# Patient Record
Sex: Female | Born: 1985 | Race: Black or African American | Hispanic: No | Marital: Single | Smoking: Current every day smoker
Health system: Southern US, Community
[De-identification: ages and names within clinical notes are randomized; demographics above are authoritative.]

## PROBLEM LIST (undated history)

## (undated) ENCOUNTER — Inpatient Hospital Stay (HOSPITAL_COMMUNITY): Payer: Self-pay

## (undated) DIAGNOSIS — A749 Chlamydial infection, unspecified: Secondary | ICD-10-CM

## (undated) DIAGNOSIS — Z332 Encounter for elective termination of pregnancy: Secondary | ICD-10-CM

## (undated) DIAGNOSIS — R87619 Unspecified abnormal cytological findings in specimens from cervix uteri: Secondary | ICD-10-CM

## (undated) DIAGNOSIS — IMO0002 Reserved for concepts with insufficient information to code with codable children: Secondary | ICD-10-CM

## (undated) HISTORY — PX: DILATION AND CURETTAGE OF UTERUS: SHX78

## (undated) HISTORY — PX: NO PAST SURGERIES: SHX2092

---
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
## 2008-10-16 ENCOUNTER — Emergency Department (HOSPITAL_COMMUNITY): Admission: EM | Admit: 2008-10-16 | Discharge: 2008-10-16 | Payer: Self-pay | Admitting: Emergency Medicine

## 2011-03-31 ENCOUNTER — Other Ambulatory Visit (INDEPENDENT_AMBULATORY_CARE_PROVIDER_SITE_OTHER): Payer: BC Managed Care – PPO

## 2011-03-31 DIAGNOSIS — N979 Female infertility, unspecified: Secondary | ICD-10-CM

## 2011-04-13 ENCOUNTER — Other Ambulatory Visit (INDEPENDENT_AMBULATORY_CARE_PROVIDER_SITE_OTHER): Payer: BC Managed Care – PPO

## 2011-04-13 DIAGNOSIS — E282 Polycystic ovarian syndrome: Secondary | ICD-10-CM

## 2011-04-30 ENCOUNTER — Telehealth: Payer: Self-pay

## 2011-04-30 NOTE — Telephone Encounter (Signed)
Left message for pt. To return call regarding labs. PG CMA

## 2011-05-03 ENCOUNTER — Telehealth: Payer: Self-pay

## 2011-05-03 NOTE — Telephone Encounter (Signed)
Returned pt's call advised labs done on 04/13/11 were supportive of PCO. PG CMA

## 2011-10-19 ENCOUNTER — Inpatient Hospital Stay (HOSPITAL_COMMUNITY)
Admission: AD | Admit: 2011-10-19 | Discharge: 2011-10-19 | Disposition: A | Discharge status: Home / Self Care | Payer: Self-pay | Source: Ambulatory Visit | Attending: Obstetrics and Gynecology | Admitting: Obstetrics and Gynecology

## 2011-10-19 ENCOUNTER — Inpatient Hospital Stay (HOSPITAL_COMMUNITY): Payer: Self-pay

## 2011-10-19 ENCOUNTER — Encounter (HOSPITAL_COMMUNITY): Payer: Self-pay | Admitting: *Deleted

## 2011-10-19 DIAGNOSIS — O034 Incomplete spontaneous abortion without complication: Secondary | ICD-10-CM

## 2011-10-19 DIAGNOSIS — R109 Unspecified abdominal pain: Secondary | ICD-10-CM | POA: Insufficient documentation

## 2011-10-19 DIAGNOSIS — O209 Hemorrhage in early pregnancy, unspecified: Secondary | ICD-10-CM | POA: Insufficient documentation

## 2011-10-19 HISTORY — DX: Reserved for concepts with insufficient information to code with codable children: IMO0002

## 2011-10-19 HISTORY — DX: Chlamydial infection, unspecified: A74.9

## 2011-10-19 HISTORY — DX: Encounter for elective termination of pregnancy: Z33.2

## 2011-10-19 HISTORY — DX: Unspecified abnormal cytological findings in specimens from cervix uteri: R87.619

## 2011-10-19 LAB — URINALYSIS, ROUTINE W REFLEX MICROSCOPIC
Bilirubin Urine: NEGATIVE
Glucose, UA: NEGATIVE mg/dL
Ketones, ur: NEGATIVE mg/dL
Leukocytes, UA: NEGATIVE
Nitrite: NEGATIVE
Protein, ur: NEGATIVE mg/dL
Specific Gravity, Urine: 1.02 (ref 1.005–1.030)
Urobilinogen, UA: 0.2 mg/dL (ref 0.0–1.0)
pH: 7.5 (ref 5.0–8.0)

## 2011-10-19 LAB — URINE MICROSCOPIC-ADD ON

## 2011-10-19 LAB — POCT PREGNANCY, URINE: Preg Test, Ur: POSITIVE — AB

## 2011-10-19 NOTE — MAU Provider Note (Signed)
  History    This patient has given a history of vaginal bleeding since early this morning and associated with abdominal cramping. Soaked 1 maxi pad and now the bleeding has decreased. The patient had taken Ibuprofen 600mg s po x 1 2 hrs ago and has no cramping at the time of examination.  CSN: [id number]  Arrival date and time: 10/19/11 1157   First Provider Initiated Contact with Patient 10/19/11 1424      Chief Complaint  Patient presents with  . Vaginal Bleeding   HPI  OB History    Grav Para Term Preterm Abortions TAB SAB Ect Mult Living   5    3 1     0      Past Medical History  Diagnosis Date  . Elective abortion   . Abnormal Pap smear   . Chlamydia     Past Surgical History  Procedure Date  . No past surgeries     Family History  Problem Relation Age of Onset  . Other Neg Hx     History  Substance Use Topics  . Smoking status: Former Smoker -- 0.5 packs/day    Types: Cigarettes    Quit date: 10/05/2011  . Smokeless tobacco: Never Used  . Alcohol Use: No    Allergies: No Known Allergies  Prescriptions prior to admission  Medication Sig Dispense Refill  . ibuprofen (ADVIL,MOTRIN) 200 MG tablet Take 600 mg by mouth every 6 (six) hours as needed. For pain        Review of Systems  Constitutional: Negative.   HENT: Negative.   Eyes: Negative.   Respiratory: Negative.   Cardiovascular: Negative.   Gastrointestinal: Negative.   Genitourinary: Negative.   Musculoskeletal: Negative.   Skin: Negative.   Neurological: Negative.   Endo/Heme/Allergies: Negative.   Psychiatric/Behavioral: Negative.    Physical Exam   Blood pressure 126/76, pulse 67, temperature 98 F (36.7 C), temperature source Oral, resp. rate 18, height 5' 2.75" (1.594 m), weight 227 lb 2 oz (103.023 kg), last menstrual period 08/19/2011.  Physical Exam  Constitutional: She is oriented to person, place, and time. She appears well-developed and well-nourished.  HENT:  Head:  Normocephalic and atraumatic.  Eyes: Conjunctivae normal and EOM are normal. Pupils are equal, round, and reactive to light.  Neck: Normal range of motion.  Cardiovascular: Normal rate, regular rhythm and normal heart sounds.   Respiratory: Effort normal and breath sounds normal.  GI: Soft. Bowel sounds are normal.  Genitourinary: Vaginal discharge found.       Vaginal bleeding since this morning associated with cramping.  Musculoskeletal: Normal range of motion.  Neurological: She is alert and oriented to person, place, and time. She has normal reflexes.  Skin: Skin is warm and dry.  Psychiatric: She has a normal mood and affect.    MAU Course  Procedures  GA [redacted]w[redacted]d vaginal bleeding with cramping. Evaluation of Differential diagnosis A) SCH or B) SAB  Transvaginal USS Baseline Quant today    Assessment and Plan  Vaginal bleeding at [redacted]w[redacted]d Transvaginal USS-  Result - Probable early, approx 5 weeks IUGS. No adnexal mass identified. Recommended quant series and F/U USS in 10 days. Baseline Quant today and to have repeat Quant in 48 hrs. Also to f/u with Quant and f/u USS for viability pending on Quant results F/U [facility] 10/21/11 for Qant  Patrizia Paule, CNM. 10/19/2011, 2:26 PM

## 2011-10-19 NOTE — MAU Note (Signed)
Pt states unsure of LMP, thinks it is 08/19/2011. Here for ? Miscarriage. Started spotting Sunday night into this am, went to work, noted sharp pains, then "started flowing". Changing pad q30 minutes. G5P0, one prior miscarriage, TAB x3. Noted odor to vaginal discharge, though no abnormal color. Cramping in lower abdomen bilaterally.

## 2011-10-19 NOTE — Progress Notes (Signed)
Pt given a phone at her request and informed that we have received the report

## 2011-10-20 ENCOUNTER — Telehealth: Payer: Self-pay

## 2011-10-20 DIAGNOSIS — N939 Abnormal uterine and vaginal bleeding, unspecified: Secondary | ICD-10-CM

## 2011-10-20 NOTE — Telephone Encounter (Signed)
Message copied by Janeece Agee on Wed Oct 20, 2011 12:54 PM ------      Message from: Earl Gala      Created: Tue Oct 19, 2011  5:37 PM      Regarding: Needs Lab draw at the office : Quant for 10/21/11 ( 48 hrs f/u)       Hi Ty!       Needs a Quant on Thursday the 3rd for f/u 48hrs quant to see if pregnancy ongoing

## 2011-10-20 NOTE — Telephone Encounter (Signed)
Spoke with pt informing her Sharene Butters is needed per DD. Quant scheduled 10/21/11.

## 2011-10-20 NOTE — Telephone Encounter (Signed)
LM for pt regarding quant per DD.

## 2011-10-21 DIAGNOSIS — N939 Abnormal uterine and vaginal bleeding, unspecified: Secondary | ICD-10-CM

## 2011-10-21 LAB — HCG, QUANTITATIVE, PREGNANCY: hCG, Beta Chain, Quant, S: 2564 m[IU]/mL

## 2011-10-22 ENCOUNTER — Telehealth: Payer: Self-pay

## 2011-10-22 DIAGNOSIS — O039 Complete or unspecified spontaneous abortion without complication: Secondary | ICD-10-CM

## 2011-10-22 NOTE — Telephone Encounter (Signed)
Message copied by Janeece Agee on Fri Oct 22, 2011  4:04 PM ------      Message from: Earl Gala      Created: Fri Oct 22, 2011 11:28 AM      Regarding: Declining Quant       Hi Ty,      This lady has a declining Quant.      Tues: > 9000      Thurs: just over 2000            Will need f/u Quant on Monday.      Thanks,      [place].

## 2011-10-22 NOTE — Telephone Encounter (Signed)
Spoke with pt informing her quants are dropping and rpt quant is recommended per DD on Monday. Pt scheduled 10/25/11 @ 10:30 am. Pt voices understanding.

## 2011-10-25 ENCOUNTER — Other Ambulatory Visit: Payer: BC Managed Care – PPO

## 2011-11-04 ENCOUNTER — Encounter: Payer: BC Managed Care – PPO | Admitting: Obstetrics and Gynecology

## 2012-01-19 NOTE — L&D Delivery Note (Signed)
Delivery Note  Pt pushed approximately and then had vomiting as vtx began to crown   At 9:37 PM a viable female was delivered via Vaginal, Spontaneous Delivery (Presentation: Right Occiput Anterior).  Delivered through loose nuchal cord, APGAR: 9, 9; weight 7 lb 3.9 oz (3286 g).   Placenta status: Intact, Spontaneous.  Cord: 3 vessels with the following complications: None.  Cord pH: n/a   Anesthesia: Epidural  Episiotomy: None Lacerations: partial 3rd degree;  Suture Repair: 3.0 vicryl Est. Blood Loss (mL): 400cc   Dr [name] called to bs to assist w laceration repair  Mom to postpartum.  Baby to Couplet care / Skin to Skin. All counts correct   Allison Sheppard M 11/23/2012, 12:15 AM

## 2012-06-23 ENCOUNTER — Inpatient Hospital Stay (HOSPITAL_COMMUNITY)
Admission: AD | Admit: 2012-06-23 | Discharge: 2012-06-23 | Disposition: A | Discharge status: Home / Self Care | Payer: 59 | Source: Ambulatory Visit | Attending: Obstetrics and Gynecology | Admitting: Obstetrics and Gynecology

## 2012-06-23 ENCOUNTER — Encounter (HOSPITAL_COMMUNITY): Payer: Self-pay

## 2012-06-23 DIAGNOSIS — R42 Dizziness and giddiness: Secondary | ICD-10-CM | POA: Insufficient documentation

## 2012-06-23 DIAGNOSIS — O219 Vomiting of pregnancy, unspecified: Secondary | ICD-10-CM

## 2012-06-23 DIAGNOSIS — O210 Mild hyperemesis gravidarum: Secondary | ICD-10-CM | POA: Insufficient documentation

## 2012-06-23 LAB — URINALYSIS, ROUTINE W REFLEX MICROSCOPIC
Bilirubin Urine: NEGATIVE
Glucose, UA: NEGATIVE mg/dL
Ketones, ur: >80 mg/dL — AB
Leukocytes, UA: NEGATIVE
Nitrite: NEGATIVE
Specific Gravity, Urine: >1.03 — ABNORMAL HIGH (ref 1.005–1.030)
pH: 6 (ref 5.0–8.0)

## 2012-06-23 LAB — WET PREP, GENITAL: Yeast Wet Prep HPF POC: NONE SEEN

## 2012-06-23 LAB — CBC WITH DIFFERENTIAL/PLATELET
HCT: 38.7 % (ref 36.0–46.0)
Hemoglobin: 13.3 g/dL (ref 12.0–15.0)
Lymphs Abs: 2.9 10*3/uL (ref 0.7–4.0)
MCH: 29.3 pg (ref 26.0–34.0)
MCHC: 34.4 g/dL (ref 30.0–36.0)
Monocytes Absolute: 0.8 10*3/uL (ref 0.1–1.0)
Monocytes Relative: 5 % (ref 3–12)
Neutro Abs: 11.2 10*3/uL — ABNORMAL HIGH (ref 1.7–7.7)
Neutrophils Relative %: 75 % (ref 43–77)
RBC: 4.54 MIL/uL (ref 3.87–5.11)

## 2012-06-23 LAB — COMPREHENSIVE METABOLIC PANEL
ALT: 41 U/L — ABNORMAL HIGH (ref 0–35)
AST: 36 U/L (ref 0–37)
Albumin: 3.4 g/dL — ABNORMAL LOW (ref 3.5–5.2)
CO2: 23 mEq/L (ref 19–32)
Calcium: 9.2 mg/dL (ref 8.4–10.5)
Chloride: 102 mEq/L (ref 96–112)
Glucose, Bld: 85 mg/dL (ref 70–99)
Potassium: 3.6 mEq/L (ref 3.5–5.1)
Total Protein: 7.2 g/dL (ref 6.0–8.3)

## 2012-06-23 LAB — URINE MICROSCOPIC-ADD ON

## 2012-06-23 MED ORDER — LACTATED RINGERS IV SOLN
INTRAVENOUS | Status: DC
  Administered 2012-06-23: 17:00:00 via INTRAVENOUS

## 2012-06-23 MED ORDER — LACTATED RINGERS IV BOLUS (SEPSIS)
500.0000 mL | Freq: Once | INTRAVENOUS | Status: AC
  Administered 2012-06-23: 500 mL via INTRAVENOUS

## 2012-06-23 MED ORDER — PANTOPRAZOLE SODIUM 20 MG PO TBEC: 20.0000 mg | DELAYED_RELEASE_TABLET | Freq: Every day | ORAL | Status: DC

## 2012-06-23 MED ORDER — ONDANSETRON HCL 4 MG/2ML IJ SOLN
4.0000 mg | Freq: Once | INTRAMUSCULAR | Status: AC
  Administered 2012-06-23: 4 mg via INTRAVENOUS
  Filled 2012-06-23: qty 2

## 2012-06-23 NOTE — MAU Note (Signed)
Patient states she had gotten food poisoning on 6-4 and was given Zofran. Patient states that she has continued to have vomiting and has been taking Zofran. States she has been feeling dizzy and lightheaded. Denies any pregnancy problems, bleeding, leaking or pain.

## 2012-06-23 NOTE — MAU Provider Note (Signed)
History   27 yo, [id number] at [redacted]w[redacted]d presents after calling the office with c/o N/V which she feels is associated with the dizziness she is experiencing.  Wed she ate a chicken sandwich from [employer] and soon after n/v.  Spoke to office and got Zofran ODT.  Reports Zofran works a little bit.  She states that she just doesn't feel like eating and drinking cause of the dizziness.  Pt reports that she feels like she is not able to fully empty her bladder when she voids but denies burning with urination.  Denies VB, UCs, LOF, recent fever, resp or GI c/o's, PIH s/s. No one is sick in her family.  She has not begun her [facility] d/t insurance issues.  Chief Complaint  Patient presents with  . Dizziness  . Emesis    OB History   Grav Para Term Preterm Abortions TAB SAB Ect Mult Living   6 2   3 1    2       Past Medical History  Diagnosis Date  . Elective abortion   . Abnormal Pap smear   . Chlamydia     Past Surgical History  Procedure Laterality Date  . No past surgeries      Family History  Problem Relation Age of Onset  . Other Neg Hx     History  Substance Use Topics  . Smoking status: Former Smoker -- 0.50 packs/day    Types: Cigarettes    Quit date: 10/05/2011  . Smokeless tobacco: Never Used  . Alcohol Use: No    Allergies: No Known Allergies  Prescriptions prior to admission  Medication Sig Dispense Refill  . loratadine (CLARITIN) 10 MG tablet Take 10 mg by mouth daily as needed for allergies.      Marland Kitchen ondansetron (ZOFRAN-ODT) 4 MG disintegrating tablet Take 4 mg by mouth every 8 (eight) hours as needed for nausea.      . Prenatal Vit-Min-FA-Fish Oil ([employer] PRENATAL GUMMY PO) Take 2 tablets by mouth daily.        ROS: see HPI above, all other systems are negative   Physical Exam   Blood pressure 120/71, pulse 84, temperature 98.3 F (36.8 C), temperature source Oral, resp. rate 16, height 5' 2.5" (1.588 m), weight 246 lb 12.8 oz (111.948 kg), last menstrual period  02/12/2012, SpO2 100.00%, unknown if currently breastfeeding.  Chest: Clear Heart: RRR Abdomen: gravid, NT Extremities: WNL  Results for orders placed during the hospital encounter of 06/23/12 (from the past 24 hour(s))  URINALYSIS, ROUTINE W REFLEX MICROSCOPIC     Status: Abnormal   Collection Time    06/23/12  2:25 PM      Result Value Range   Color, Urine AMBER (*) YELLOW   APPearance CLEAR  CLEAR   Specific Gravity, Urine >1.030 (*) 1.005 - 1.030   pH 6.0  5.0 - 8.0   Glucose, UA NEGATIVE  NEGATIVE mg/dL   Hgb urine dipstick TRACE (*) NEGATIVE   Bilirubin Urine NEGATIVE  NEGATIVE   Ketones, ur >80 (*) NEGATIVE mg/dL   Protein, ur NEGATIVE  NEGATIVE mg/dL   Urobilinogen, UA 1.0  0.0 - 1.0 mg/dL   Nitrite NEGATIVE  NEGATIVE   Leukocytes, UA NEGATIVE  NEGATIVE  URINE MICROSCOPIC-ADD ON     Status: Abnormal   Collection Time    06/23/12  2:25 PM      Result Value Range   Squamous Epithelial / LPF FEW (*) RARE   WBC, UA 0-2  <3  WBC/hpf   Bacteria, UA FEW (*) RARE  CBC WITH DIFFERENTIAL     Status: Abnormal   Collection Time    06/23/12  2:33 PM      Result Value Range   WBC 14.9 (*) 4.0 - 10.5 K/uL   RBC 4.54  3.87 - 5.11 MIL/uL   Hemoglobin 13.3  12.0 - 15.0 g/dL   HCT 16.1  09.6 - 04.5 %   MCV 85.2  78.0 - 100.0 fL   MCH 29.3  26.0 - 34.0 pg   MCHC 34.4  30.0 - 36.0 g/dL   RDW 40.9  81.1 - 91.4 %   Platelets 329  150 - 400 K/uL   Neutrophils Relative % 75  43 - 77 %   Neutro Abs 11.2 (*) 1.7 - 7.7 K/uL   Lymphocytes Relative 20  12 - 46 %   Lymphs Abs 2.9  0.7 - 4.0 K/uL   Monocytes Relative 5  3 - 12 %   Monocytes Absolute 0.8  0.1 - 1.0 K/uL   Eosinophils Relative 0  0 - 5 %   Eosinophils Absolute 0.0  0.0 - 0.7 K/uL   Basophils Relative 0  0 - 1 %   Basophils Absolute 0.0  0.0 - 0.1 K/uL  COMPREHENSIVE METABOLIC PANEL     Status: Abnormal   Collection Time    06/23/12  2:33 PM      Result Value Range   Sodium 135  135 - 145 mEq/L   Potassium 3.6  3.5 -  5.1 mEq/L   Chloride 102  96 - 112 mEq/L   CO2 23  19 - 32 mEq/L   Glucose, Bld 85  70 - 99 mg/dL   BUN 7  6 - 23 mg/dL   Creatinine, Ser 7.82  0.50 - 1.10 mg/dL   Calcium 9.2  8.4 - 95.6 mg/dL   Total Protein 7.2  6.0 - 8.3 g/dL   Albumin 3.4 (*) 3.5 - 5.2 g/dL   AST 36  0 - 37 U/L   ALT 41 (*) 0 - 35 U/L   Alkaline Phosphatase 68  39 - 117 U/L   Total Bilirubin 0.4  0.3 - 1.2 mg/dL   GFR calc non Af Amer >90  >90 mL/min   GFR calc Af Amer >90  >90 mL/min  WET PREP, GENITAL     Status: Abnormal   Collection Time    06/23/12  4:41 PM      Result Value Range   Yeast Wet Prep HPF POC NONE SEEN  NONE SEEN   Trich, Wet Prep NONE SEEN  NONE SEEN   Clue Cells Wet Prep HPF POC FEW (*) NONE SEEN   WBC, Wet Prep HPF POC FEW (*) NONE SEEN     ED Course  IUP at 103w6d Dizziness and N/V  IV LR Bolus Zofran 4 mg IV once PO trial  C/w Dr Normand Sloop "Feeling better" Tolerated PO  D/c home to continue Zofran Rx Protonix 20 mg QD  F/u as soon as possible with [facility] to start [facility]   Haroldine Laws CNM, MSN 06/23/2012 6:03 PM

## 2012-06-26 ENCOUNTER — Inpatient Hospital Stay (HOSPITAL_COMMUNITY): Payer: 59

## 2012-06-26 ENCOUNTER — Inpatient Hospital Stay (HOSPITAL_COMMUNITY)
Admission: AD | Admit: 2012-06-26 | Discharge: 2012-06-26 | Disposition: A | Discharge status: Home / Self Care | Payer: 59 | Source: Ambulatory Visit | Attending: Obstetrics and Gynecology | Admitting: Obstetrics and Gynecology

## 2012-06-26 ENCOUNTER — Encounter (HOSPITAL_COMMUNITY): Payer: Self-pay | Admitting: *Deleted

## 2012-06-26 DIAGNOSIS — E282 Polycystic ovarian syndrome: Secondary | ICD-10-CM | POA: Diagnosis not present

## 2012-06-26 DIAGNOSIS — O99891 Other specified diseases and conditions complicating pregnancy: Secondary | ICD-10-CM | POA: Insufficient documentation

## 2012-06-26 DIAGNOSIS — R42 Dizziness and giddiness: Secondary | ICD-10-CM | POA: Diagnosis present

## 2012-06-26 DIAGNOSIS — E860 Dehydration: Secondary | ICD-10-CM | POA: Insufficient documentation

## 2012-06-26 DIAGNOSIS — Z9189 Other specified personal risk factors, not elsewhere classified: Secondary | ICD-10-CM

## 2012-06-26 DIAGNOSIS — O211 Hyperemesis gravidarum with metabolic disturbance: Secondary | ICD-10-CM | POA: Insufficient documentation

## 2012-06-26 DIAGNOSIS — O0930 Supervision of pregnancy with insufficient antenatal care, unspecified trimester: Secondary | ICD-10-CM

## 2012-06-26 LAB — CBC WITH DIFFERENTIAL/PLATELET
Basophils Absolute: 0 10*3/uL (ref 0.0–0.1)
Eosinophils Relative: 0 % (ref 0–5)
HCT: 39 % (ref 36.0–46.0)
Hemoglobin: 13.5 g/dL (ref 12.0–15.0)
Lymphocytes Relative: 18 % (ref 12–46)
MCHC: 34.6 g/dL (ref 30.0–36.0)
MCV: 84.8 fL (ref 78.0–100.0)
Monocytes Absolute: 0.8 10*3/uL (ref 0.1–1.0)
Monocytes Relative: 6 % (ref 3–12)
RDW: 13.9 % (ref 11.5–15.5)
WBC: 14.7 10*3/uL — ABNORMAL HIGH (ref 4.0–10.5)

## 2012-06-26 LAB — COMPREHENSIVE METABOLIC PANEL
AST: 21 U/L (ref 0–37)
BUN: 4 mg/dL — ABNORMAL LOW (ref 6–23)
CO2: 22 mEq/L (ref 19–32)
Calcium: 9.4 mg/dL (ref 8.4–10.5)
Chloride: 101 mEq/L (ref 96–112)
Creatinine, Ser: 0.57 mg/dL (ref 0.50–1.10)
GFR calc Af Amer: >90 mL/min (ref >90)
GFR calc non Af Amer: >90 mL/min (ref >90)
Total Bilirubin: 0.3 mg/dL (ref 0.3–1.2)

## 2012-06-26 LAB — URINALYSIS, ROUTINE W REFLEX MICROSCOPIC
Glucose, UA: NEGATIVE mg/dL
Hgb urine dipstick: NEGATIVE
Ketones, ur: >80 mg/dL — AB
Protein, ur: NEGATIVE mg/dL
pH: 6 (ref 5.0–8.0)

## 2012-06-26 MED ORDER — MECLIZINE HCL 25 MG PO TABS
50.0000 mg | ORAL_TABLET | Freq: Two times a day (BID) | ORAL | Status: DC
  Administered 2012-06-26: 50 mg via ORAL
  Filled 2012-06-26 (×3): qty 2

## 2012-06-26 MED ORDER — ONDANSETRON HCL 4 MG/2ML IJ SOLN
4.0000 mg | Freq: Once | INTRAMUSCULAR | Status: AC
  Administered 2012-06-26: 4 mg via INTRAVENOUS
  Filled 2012-06-26: qty 2

## 2012-06-26 MED ORDER — LACTATED RINGERS IV SOLN
INTRAVENOUS | Status: DC
  Administered 2012-06-26: 13:00:00 via INTRAVENOUS

## 2012-06-26 MED ORDER — LACTATED RINGERS IV SOLN
Freq: Once | INTRAVENOUS | Status: AC
  Administered 2012-06-26: 16:00:00 via INTRAVENOUS

## 2012-06-26 MED ORDER — LACTATED RINGERS IV BOLUS (SEPSIS)
500.0000 mL | Freq: Once | INTRAVENOUS | Status: AC
  Administered 2012-06-26: 500 mL via INTRAVENOUS

## 2012-06-26 MED ORDER — MECLIZINE HCL 50 MG PO TABS: 50.0000 mg | ORAL_TABLET | Freq: Two times a day (BID) | ORAL | Status: DC

## 2012-06-26 NOTE — MAU Note (Signed)
Patient state she has not been able to eat and has vomiting when she does eat. Gets dizzy and lightheaded. Denies pain or bleeding.

## 2012-06-26 NOTE — MAU Provider Note (Signed)
History   27 yo G2P0010 at 41 2/7 weeks by LMP presented unannounced c/o dizziness and N/V approx 1 week--seen 6/6 in MAU for same, with labs and cultures done that day, IV hydration, and Zofran.  Patient has not started [facility] yet at [facility] due to insurance issues.  Denies fever, dysuria, bleeding, leaking, HA, viral exposure, visual issues, or any other sx.  Hx seasonal allergies, but has been off Claritin for 2 weeks "since I haven't had any allergy sx recently"  Reports the dizziness is the primary issue, noted when she changes position or turns.  Nausea occurs after the dizziness.    Patient Active Problem List   Diagnosis Date Noted  . Late prenatal care 06/26/2012  . PCOS (polycystic ovarian syndrome) 06/26/2012  . Potential abnormality of glucose tolerance 06/26/2012  . Vaginal bleeding in pregnancy 06/26/2012  Seen in 1st trimester for bleeding.  Chief Complaint  Patient presents with  . Dizziness  . Emesis During Pregnancy     OB History   Grav Para Term Preterm Abortions TAB SAB Ect Mult Living   2 0   1  1   0      Past Medical History  Diagnosis Date  . Elective abortion   . Abnormal Pap smear   . Chlamydia     Past Surgical History  Procedure Laterality Date  . No past surgeries      Family History  Problem Relation Age of Onset  . Other Neg Hx     History  Substance Use Topics  . Smoking status: Former Smoker -- 0.50 packs/day    Types: Cigarettes    Quit date: 10/05/2011  . Smokeless tobacco: Never Used  . Alcohol Use: No    Allergies: No Known Allergies  Prescriptions prior to admission  Medication Sig Dispense Refill  . ondansetron (ZOFRAN-ODT) 4 MG disintegrating tablet Take 4 mg by mouth every 8 (eight) hours as needed for nausea.      Marland Kitchen loratadine (CLARITIN) 10 MG tablet Take 10 mg by mouth daily as needed for allergies.      . pantoprazole (PROTONIX) 20 MG tablet Take 1 tablet (20 mg total) by mouth daily.  30 tablet  1  . Prenatal  Vit-Min-FA-Fish Oil ([employer] PRENATAL GUMMY PO) Take 2 tablets by mouth daily.         Physical Exam   Blood pressure 129/79, pulse 96, temperature 98.6 F (37 C), temperature source Oral, resp. rate 16, last menstrual period 02/12/2012, SpO2 99.00%.  Chest clear Ears--left ear drum full, no erythema or drainage;  Right eardrum clear Heart RRR without murmur Abd gravid, NT, FH approx 19 weeks Pelvic--deferred Ext WNL  FHR 160  Results for orders placed during the hospital encounter of 06/26/12 (from the past 24 hour(s))  CBC WITH DIFFERENTIAL     Status: Abnormal   Collection Time    06/26/12 12:05 PM      Result Value Range   WBC 14.7 (*) 4.0 - 10.5 K/uL   RBC 4.60  3.87 - 5.11 MIL/uL   Hemoglobin 13.5  12.0 - 15.0 g/dL   HCT 16.1  09.6 - 04.5 %   MCV 84.8  78.0 - 100.0 fL   MCH 29.3  26.0 - 34.0 pg   MCHC 34.6  30.0 - 36.0 g/dL   RDW 40.9  81.1 - 91.4 %   Platelets 330  150 - 400 K/uL   Neutrophils Relative % 76  43 - 77 %  Neutro Abs 11.2 (*) 1.7 - 7.7 K/uL   Lymphocytes Relative 18  12 - 46 %   Lymphs Abs 2.7  0.7 - 4.0 K/uL   Monocytes Relative 6  3 - 12 %   Monocytes Absolute 0.8  0.1 - 1.0 K/uL   Eosinophils Relative 0  0 - 5 %   Eosinophils Absolute 0.0  0.0 - 0.7 K/uL   Basophils Relative 0  0 - 1 %   Basophils Absolute 0.0  0.0 - 0.1 K/uL  COMPREHENSIVE METABOLIC PANEL     Status: Abnormal   Collection Time    06/26/12 12:05 PM      Result Value Range   Sodium 134 (*) 135 - 145 mEq/L   Potassium 3.9  3.5 - 5.1 mEq/L   Chloride 101  96 - 112 mEq/L   CO2 22  19 - 32 mEq/L   Glucose, Bld 85  70 - 99 mg/dL   BUN 4 (*) 6 - 23 mg/dL   Creatinine, Ser 8.46  0.50 - 1.10 mg/dL   Calcium 9.4  8.4 - 96.2 mg/dL   Total Protein 7.3  6.0 - 8.3 g/dL   Albumin 3.2 (*) 3.5 - 5.2 g/dL   AST 21  0 - 37 U/L   ALT 28  0 - 35 U/L   Alkaline Phosphatase 71  39 - 117 U/L   Total Bilirubin 0.3  0.3 - 1.2 mg/dL   GFR calc non Af Amer >90  >90 mL/min   GFR calc Af Amer >90   >90 mL/min  URINALYSIS, ROUTINE W REFLEX MICROSCOPIC     Status: Abnormal   Collection Time    06/26/12 12:15 PM      Result Value Range   Color, Urine YELLOW  YELLOW   APPearance CLEAR  CLEAR   Specific Gravity, Urine 1.025  1.005 - 1.030   pH 6.0  5.0 - 8.0   Glucose, UA NEGATIVE  NEGATIVE mg/dL   Hgb urine dipstick NEGATIVE  NEGATIVE   Bilirubin Urine NEGATIVE  NEGATIVE   Ketones, ur >80 (*) NEGATIVE mg/dL   Protein, ur NEGATIVE  NEGATIVE mg/dL   Urobilinogen, UA 1.0  0.0 - 1.0 mg/dL   Nitrite NEGATIVE  NEGATIVE   Leukocytes, UA NEGATIVE  NEGATIVE   Limited US:  SIUP, 17 6/7 weeks by [place], with EDC 11/11/4 (19 2/7 weeks by LMP).  Placenta posterior, cervix 3.09  Able to tolerate sips of liquids and few crackers in MAU with administration of Meclizine.  ED Course  IUP at 19 2/7 weeks LMP, 17 6/7 weeks by [place] Dizziness/vertigo, with N/V resulting--? Due to inner ear congestion Dehydration Hx PCOS No prenatal care  Plan: Consulted with Dr. [name] IV hydration with LR--2 bags total Zofran for nausea (has Rx at home for this) Meclizine po for dizziness. Will complete current IV fluids, then plan d/c home. Rx Meclizine 50 mg po BID--sent to patient's pharmacy Continue Zofran Restart Claritin daily OOW x 2 days, to allow for taking of meds and increasing po fluids.  Works at [employer].  RTW on Thursday. Needs to schedule OB care ASAP.   Nigel Bridgeman CNM, [place] 06/26/2012 3:21 PM

## 2012-07-26 LAB — OB RESULTS CONSOLE HEPATITIS B SURFACE ANTIGEN: Hepatitis B Surface Ag: NEGATIVE

## 2012-07-26 LAB — OB RESULTS CONSOLE ABO/RH: RH Type: POSITIVE

## 2012-07-26 LAB — OB RESULTS CONSOLE RPR: RPR: NONREACTIVE

## 2012-07-26 LAB — OB RESULTS CONSOLE HIV ANTIBODY (ROUTINE TESTING): HIV: NONREACTIVE

## 2012-10-25 LAB — OB RESULTS CONSOLE GBS: GBS: NEGATIVE

## 2012-11-22 ENCOUNTER — Encounter (HOSPITAL_COMMUNITY): Payer: Self-pay | Admitting: *Deleted

## 2012-11-22 ENCOUNTER — Inpatient Hospital Stay (HOSPITAL_COMMUNITY): Payer: 59 | Admitting: Anesthesiology

## 2012-11-22 ENCOUNTER — Inpatient Hospital Stay (HOSPITAL_COMMUNITY)
Admission: AD | Admit: 2012-11-22 | Discharge: 2012-11-24 | DRG: 775 | Disposition: A | Discharge status: Home / Self Care | Payer: 59 | Source: Ambulatory Visit | Attending: Obstetrics and Gynecology | Admitting: Obstetrics and Gynecology

## 2012-11-22 ENCOUNTER — Encounter (HOSPITAL_COMMUNITY): Payer: 59 | Admitting: Anesthesiology

## 2012-11-22 DIAGNOSIS — O9903 Anemia complicating the puerperium: Secondary | ICD-10-CM | POA: Diagnosis not present

## 2012-11-22 DIAGNOSIS — D649 Anemia, unspecified: Secondary | ICD-10-CM | POA: Diagnosis not present

## 2012-11-22 LAB — CBC
HCT: 37.4 % (ref 36.0–46.0)
Hemoglobin: 12.9 g/dL (ref 12.0–15.0)
MCH: 27.9 pg (ref 26.0–34.0)
MCHC: 34.5 g/dL (ref 30.0–36.0)

## 2012-11-22 LAB — ABO/RH: ABO/RH(D): O POS

## 2012-11-22 MED ORDER — OXYTOCIN 40 UNITS IN LACTATED RINGERS INFUSION - SIMPLE MED
1.0000 m[IU]/min | INTRAVENOUS | Status: DC
  Administered 2012-11-22: 2 m[IU]/min via INTRAVENOUS
  Filled 2012-11-22: qty 1000

## 2012-11-22 MED ORDER — FENTANYL CITRATE 0.05 MG/ML IJ SOLN
INTRAMUSCULAR | Status: DC | PRN
  Administered 2012-11-22: 100 ug via EPIDURAL

## 2012-11-22 MED ORDER — FENTANYL CITRATE 0.05 MG/ML IJ SOLN
100.0000 ug | Freq: Once | INTRAMUSCULAR | Status: AC
  Administered 2012-11-22: 100 ug via INTRAVENOUS
  Filled 2012-11-22: qty 2

## 2012-11-22 MED ORDER — PHENYLEPHRINE 40 MCG/ML (10ML) SYRINGE FOR IV PUSH (FOR BLOOD PRESSURE SUPPORT)
PREFILLED_SYRINGE | INTRAVENOUS | Status: AC
  Filled 2012-11-22: qty 5

## 2012-11-22 MED ORDER — FENTANYL 2.5 MCG/ML BUPIVACAINE 1/10 % EPIDURAL INFUSION (WH - ANES)
INTRAMUSCULAR | Status: DC | PRN
  Administered 2012-11-22: 12.5 mL/h via EPIDURAL

## 2012-11-22 MED ORDER — FLEET ENEMA 7-19 GM/118ML RE ENEM: 1.0000 | ENEMA | RECTAL | Status: DC | PRN

## 2012-11-22 MED ORDER — CITRIC ACID-SODIUM CITRATE 334-500 MG/5ML PO SOLN: 30.0000 mL | ORAL | Status: DC | PRN

## 2012-11-22 MED ORDER — LIDOCAINE HCL (PF) 1 % IJ SOLN
30.0000 mL | INTRAMUSCULAR | Status: DC | PRN
  Administered 2012-11-22: 30 mL via SUBCUTANEOUS
  Filled 2012-11-22 (×3): qty 30

## 2012-11-22 MED ORDER — LACTATED RINGERS IV SOLN
500.0000 mL | INTRAVENOUS | Status: DC | PRN
  Administered 2012-11-22: 1000 mL via INTRAVENOUS
  Administered 2012-11-23: 500 mL via INTRAVENOUS

## 2012-11-22 MED ORDER — TERBUTALINE SULFATE 1 MG/ML IJ SOLN: 0.2500 mg | Freq: Once | INTRAMUSCULAR | Status: AC | PRN

## 2012-11-22 MED ORDER — ACETAMINOPHEN 325 MG PO TABS: 650.0000 mg | ORAL_TABLET | ORAL | Status: DC | PRN

## 2012-11-22 MED ORDER — LIDOCAINE HCL (PF) 1 % IJ SOLN
INTRAMUSCULAR | Status: DC | PRN
  Administered 2012-11-22 (×2): 4 mL

## 2012-11-22 MED ORDER — EPHEDRINE 5 MG/ML INJ
INTRAVENOUS | Status: AC
  Filled 2012-11-22: qty 4

## 2012-11-22 MED ORDER — ONDANSETRON HCL 4 MG/2ML IJ SOLN
4.0000 mg | Freq: Four times a day (QID) | INTRAMUSCULAR | Status: DC | PRN
  Administered 2012-11-22: 4 mg via INTRAVENOUS
  Filled 2012-11-22: qty 2

## 2012-11-22 MED ORDER — LACTATED RINGERS IV SOLN
INTRAVENOUS | Status: DC
  Administered 2012-11-22 – 2012-11-23 (×4): via INTRAVENOUS

## 2012-11-22 MED ORDER — OXYTOCIN BOLUS FROM INFUSION
500.0000 mL | INTRAVENOUS | Status: DC
  Administered 2012-11-22: 500 mL via INTRAVENOUS

## 2012-11-22 MED ORDER — BUPIVACAINE HCL (PF) 0.25 % IJ SOLN
INTRAMUSCULAR | Status: DC | PRN
  Administered 2012-11-22: 8 mL via EPIDURAL

## 2012-11-22 MED ORDER — DIPHENHYDRAMINE HCL 50 MG/ML IJ SOLN: 12.5000 mg | INTRAMUSCULAR | Status: DC | PRN

## 2012-11-22 MED ORDER — FENTANYL 2.5 MCG/ML BUPIVACAINE 1/10 % EPIDURAL INFUSION (WH - ANES)
INTRAMUSCULAR | Status: AC
  Administered 2012-11-22: 14 mL/h via EPIDURAL
  Filled 2012-11-22: qty 125

## 2012-11-22 MED ORDER — FENTANYL 2.5 MCG/ML BUPIVACAINE 1/10 % EPIDURAL INFUSION (WH - ANES)
14.0000 mL/h | INTRAMUSCULAR | Status: DC | PRN
  Administered 2012-11-22: 14 mL/h via EPIDURAL
  Filled 2012-11-22: qty 125

## 2012-11-22 MED ORDER — IBUPROFEN 600 MG PO TABS: 600.0000 mg | ORAL_TABLET | Freq: Four times a day (QID) | ORAL | Status: DC | PRN

## 2012-11-22 MED ORDER — EPHEDRINE 5 MG/ML INJ
10.0000 mg | INTRAVENOUS | Status: DC | PRN
  Filled 2012-11-22: qty 2

## 2012-11-22 MED ORDER — OXYTOCIN 40 UNITS IN LACTATED RINGERS INFUSION - SIMPLE MED
62.5000 mL/h | INTRAVENOUS | Status: DC
  Filled 2012-11-22: qty 1000

## 2012-11-22 MED ORDER — OXYCODONE-ACETAMINOPHEN 5-325 MG PO TABS: 1.0000 | ORAL_TABLET | ORAL | Status: DC | PRN

## 2012-11-22 MED ORDER — FENTANYL CITRATE 0.05 MG/ML IJ SOLN
INTRAMUSCULAR | Status: AC
  Administered 2012-11-22: 100 ug via INTRAVENOUS
  Filled 2012-11-22: qty 2

## 2012-11-22 MED ORDER — LACTATED RINGERS IV SOLN
500.0000 mL | Freq: Once | INTRAVENOUS | Status: AC
  Administered 2012-11-22: 500 mL via INTRAVENOUS

## 2012-11-22 MED ORDER — PHENYLEPHRINE 40 MCG/ML (10ML) SYRINGE FOR IV PUSH (FOR BLOOD PRESSURE SUPPORT)
80.0000 ug | PREFILLED_SYRINGE | INTRAVENOUS | Status: DC | PRN
  Filled 2012-11-22: qty 2

## 2012-11-22 NOTE — H&P (Signed)
Moya Duan is a 27 y.o. female presenting for onset of labor with SROM at 7:00 am ( clear fluid) and regular contractions since 8:00. She reports good fetal movement and denies symptoms of pre-eclampsia or bleeding. Comfortable with epidural  Prenatal course was normal after she entered care at 24 weeks.  OB History   Grav Para Term Preterm Abortions TAB SAB Ect Mult Living   2 0   1 1    0     Past Medical History  Diagnosis Date  . Elective abortion   . Abnormal Pap smear   . Chlamydia    Past Surgical History  Procedure Laterality Date  . No past surgeries     Family History: family history is negative for Other. Social History:  reports that she quit smoking about 13 months ago. Her smoking use included Cigarettes. She smoked 0.50 packs per day. She has never used smokeless tobacco. She reports that she does not drink alcohol or use illicit drugs.   Prenatal labs: ABO, Rh: O/Positive/-- (07/09 0000) Antibody: Negative (07/09 0000) Rubella:  immune RPR: Nonreactive (07/09 0000)  HBsAg: Negative (07/09 0000)  HIV: Non-reactive (07/09 0000)  GBS: Negative (10/08 0000) GC / Chlamydia : negative   Prenatal Transfer Tool  Maternal Diabetes: No Genetic Screening: Declined due to late entry to care Maternal Ultrasounds/Referrals: Normal Fetal Ultrasounds or other Referrals:  None Maternal Substance Abuse:  No Significant Maternal Medications:  None Significant Maternal Lab Results: None  Ultrasound: 11/08/12 at 38+4 weeks with EFW:  7 lbs 3 oz   42% and normal AFI   Dilation: 4 Effacement (%): 90 Station: -2 Exam by:: Dois Davenport Hannelore Bova MD Blood pressure 107/54, pulse 92, temperature 97.9 F (36.6 C), temperature source Oral, resp. rate 18, height 5' 2.5" (1.588 m), weight 282 lb (127.914 kg), last menstrual period 02/12/2012, SpO2 98.00%.  General Appearance: Alert, appropriate appearance for age. No acute distress HEENT Exam: Grossly normal Chest/Respiratory Exam:  Normal chest wall and respirations. Clear to auscultation Cardiovascular Exam: Regular rate and rhythm. S1, S2, no murmur Gastrointestinal Exam: soft, non-tender, Uterus gravid with size compatible with GA, Vertex presentation by Leopold's maneuvers Psychiatric Exam: Alert and oriented, appropriate affect Extremities: 1+ edema  ++++++++++++++++++++++++++++++++++++++++++++++++++++++++++++++++  Vaginal exam: 4/90/-2   Fetal tracings: Category I  ++++++++++++++++++++++++++++++++++++++++++++++++++++++++++++++++   Assessment/Plan:  SIUP at 40 + 4 weeks Active labor SVD expected   Silverio Lay MD 11/22/2012, 12:43 PM

## 2012-11-22 NOTE — MAU Note (Signed)
Contractions started this morning, were every 5, on the way here- they seemed to get closer.  Started leaking at 0700, clear fluid. No bleeding.

## 2012-11-22 NOTE — Anesthesia Procedure Notes (Signed)
Epidural Patient location during procedure: OB Start time: 11/22/2012 11:58 AM  Staffing Anesthesiologist: Emmaline Wahba A. Performed by: anesthesiologist   Preanesthetic Checklist Completed: patient identified, site marked, surgical consent, pre-op evaluation, timeout performed, IV checked, risks and benefits discussed and monitors and equipment checked  Epidural Patient position: sitting Prep: site prepped and draped and DuraPrep Patient monitoring: continuous pulse ox and blood pressure Approach: midline Injection technique: LOR air  Needle:  Needle type: Tuohy  Needle gauge: 17 G Needle length: 9 cm and 9 Needle insertion depth: 9 and 10 cm Catheter type: closed end flexible Catheter size: 19 Gauge Catheter at skin depth: 15 cm Test dose: negative and Other  Assessment Events: blood not aspirated, injection not painful, no injection resistance, negative IV test and no paresthesia  Additional Notes Patient identified. Risks and benefits discussed including failed block, incomplete  Pain control, post dural puncture headache, nerve damage, paralysis, blood pressure Changes, nausea, vomiting, reactions to medications-both toxic and allergic and post Partum back pain. All questions were answered. Patient expressed understanding and wished to proceed. Sterile technique was used throughout procedure. Epidural site was Dressed with sterile barrier dressing. No paresthesias, signs of intravascular injection Or signs of intrathecal spread were encountered.  Patient was more comfortable after the epidural was dosed. Please see RN's note for documentation of vital signs and FHR which are stable.

## 2012-11-22 NOTE — Progress Notes (Signed)
Allison Sheppard is a 27 y.o. G2P0010 at 41w4dadmitted   Subjective:  Comfortable with  Epidural Contractions every 2-3 minutes, lasting 60 seconds, intensity : palpating moderate Now on Pitocin 3 mU /min    Objective: BP 104/51  Pulse 84  Temp(Src) 98.2 F (36.8 C) (Oral)  Resp 18  Ht 5' 2.5" (1.588 m)  Wt 282 lb (127.914 kg)  BMI 50.72 kg/m2  SpO2 98%  LMP 02/12/2012      FHT: Category I SVE:   Dilation: 5 Effacement (%): 90 Station: -2 Exam by:: Michaeljames Milnes, MD  Labs: Lab Results  Component Value Date   WBC 15.9* 11/22/2012   HGB 12.9 11/22/2012   HCT 37.4 11/22/2012   MCV 81.0 11/22/2012   PLT 314 11/22/2012    Assessment / Plan: Spontaneous labor, progressing normally Fetal Wellbeing: reassuring Anticipated MOD:  NSVD IUPC placed easily to optimize Pitocin augmentation  Mick Tanguma A 11/22/2012, 5:18 PM

## 2012-11-22 NOTE — Progress Notes (Addendum)
Patient ID: Allison Sheppard, female   DOB: 1985/05/06, 27 y.o.   MRN: [id number] Allison Sheppard is a 27 y.o. G2P0010 at [redacted]w[redacted]d admitted for labor  Subjective: C/o pressure, other epidural working well   Objective: BP 122/46  Pulse 100  Temp(Src) 97.5 F (36.4 C) (Oral)  Resp 20  Ht 5' 2.5" (1.588 Sheppard)  Wt 282 lb (127.914 kg)  BMI 50.72 kg/m2  SpO2 97%  LMP 02/12/2012     FHT:  Cat 2 UC:   toco q2   SVE:   Dilation: 10 Effacement (%): 90 Station: 0 Exam by:: s. Timathy Newberry, cnm    Assessment / Plan:  Labor: Progressing normally, 2nd stage Preeclampsia:  no s/s Fetal Wellbeing:  Category II Pain Control:  Epidural Anticipated MOD:  NSVD  Begin pushing    Update physician PRN   Allison Sheppard 11/22/2012, 9:33 PM

## 2012-11-22 NOTE — MAU Note (Signed)
Patient presents with complaint of contractions since 0600, fluid leakage at 0700 and bloody show upon arrival today.

## 2012-11-22 NOTE — Anesthesia Preprocedure Evaluation (Signed)
Anesthesia Evaluation  Patient identified by MRN, date of birth, ID band Patient awake    Reviewed: Allergy & Precautions, H&P , Patient's Chart, lab work & pertinent test results  Airway Mallampati: III TM Distance: >3 FB Neck ROM: Full    Dental no notable dental hx. (+) Teeth Intact   Pulmonary former smoker,  breath sounds clear to auscultation  Pulmonary exam normal       Cardiovascular negative cardio ROS  Rhythm:Regular Rate:Normal     Neuro/Psych negative neurological ROS  negative psych ROS   GI/Hepatic negative GI ROS, Neg liver ROS,   Endo/Other  Morbid obesity  Renal/GU negative Renal ROS  negative genitourinary   Musculoskeletal negative musculoskeletal ROS (+)   Abdominal (+) + obese,   Peds  Hematology negative hematology ROS (+)   Anesthesia Other Findings   Reproductive/Obstetrics (+) Pregnancy PCOS                           Anesthesia Physical Anesthesia Plan  ASA: III  Anesthesia Plan: Epidural   Post-op Pain Management:    Induction:   Airway Management Planned: Natural Airway  Additional Equipment:   Intra-op Plan:   Post-operative Plan:   Informed Consent: I have reviewed the patients History and Physical, chart, labs and discussed the procedure including the risks, benefits and alternatives for the proposed anesthesia with the patient or authorized representative who has indicated his/her understanding and acceptance.     Plan Discussed with: Anesthesiologist  Anesthesia Plan Comments:         Anesthesia Quick Evaluation

## 2012-11-23 ENCOUNTER — Encounter (HOSPITAL_COMMUNITY): Payer: Self-pay

## 2012-11-23 LAB — CBC WITH DIFFERENTIAL/PLATELET
Band Neutrophils: 0 % (ref 0–10)
Blasts: 0 %
Eosinophils Relative: 0 % (ref 0–5)
HCT: 29.2 % — ABNORMAL LOW (ref 36.0–46.0)
Hemoglobin: 10 g/dL — ABNORMAL LOW (ref 12.0–15.0)
Lymphocytes Relative: 5 % — ABNORMAL LOW (ref 12–46)
MCHC: 34.2 g/dL (ref 30.0–36.0)
MCV: 80.9 fL (ref 78.0–100.0)
Metamyelocytes Relative: 0 %
Platelets: 273 10*3/uL (ref 150–400)
Promyelocytes Absolute: 0 %
RDW: 14.9 % (ref 11.5–15.5)
WBC: 25.2 10*3/uL — ABNORMAL HIGH (ref 4.0–10.5)
nRBC: 0 /100 WBC

## 2012-11-23 LAB — CBC
HCT: 32.1 % — ABNORMAL LOW (ref 36.0–46.0)
Hemoglobin: 10.9 g/dL — ABNORMAL LOW (ref 12.0–15.0)
MCH: 27.9 pg (ref 26.0–34.0)
MCHC: 34 g/dL (ref 30.0–36.0)
Platelets: 299 10*3/uL (ref 150–400)
RDW: 14.9 % (ref 11.5–15.5)
WBC: 29.9 10*3/uL — ABNORMAL HIGH (ref 4.0–10.5)

## 2012-11-23 LAB — PREPARE RBC (CROSSMATCH)

## 2012-11-23 MED ORDER — WITCH HAZEL-GLYCERIN EX PADS: 1.0000 "application " | MEDICATED_PAD | CUTANEOUS | Status: DC | PRN

## 2012-11-23 MED ORDER — DIBUCAINE 1 % RE OINT: 1.0000 "application " | TOPICAL_OINTMENT | RECTAL | Status: DC | PRN

## 2012-11-23 MED ORDER — BUTORPHANOL TARTRATE 1 MG/ML IJ SOLN
INTRAMUSCULAR | Status: AC
  Administered 2012-11-23: 2 mg via INTRAVENOUS
  Filled 2012-11-23: qty 2

## 2012-11-23 MED ORDER — OXYCODONE-ACETAMINOPHEN 5-325 MG PO TABS
1.0000 | ORAL_TABLET | ORAL | Status: DC | PRN
  Administered 2012-11-23: 1 via ORAL
  Filled 2012-11-23: qty 1

## 2012-11-23 MED ORDER — BUTORPHANOL TARTRATE 1 MG/ML IJ SOLN
2.0000 mg | Freq: Once | INTRAMUSCULAR | Status: AC
  Administered 2012-11-23: 2 mg via INTRAVENOUS

## 2012-11-23 MED ORDER — ZOLPIDEM TARTRATE 5 MG PO TABS: 5.0000 mg | ORAL_TABLET | Freq: Every evening | ORAL | Status: DC | PRN

## 2012-11-23 MED ORDER — IBUPROFEN 600 MG PO TABS
600.0000 mg | ORAL_TABLET | Freq: Four times a day (QID) | ORAL | Status: DC
  Administered 2012-11-23 – 2012-11-24 (×6): 600 mg via ORAL
  Filled 2012-11-23 (×6): qty 1

## 2012-11-23 MED ORDER — FLEET ENEMA 7-19 GM/118ML RE ENEM: 1.0000 | ENEMA | Freq: Every day | RECTAL | Status: DC | PRN

## 2012-11-23 MED ORDER — ONDANSETRON HCL 4 MG PO TABS: 4.0000 mg | ORAL_TABLET | ORAL | Status: DC | PRN

## 2012-11-23 MED ORDER — MEDROXYPROGESTERONE ACETATE 150 MG/ML IM SUSP: 150.0000 mg | INTRAMUSCULAR | Status: DC | PRN

## 2012-11-23 MED ORDER — BISACODYL 10 MG RE SUPP: 10.0000 mg | Freq: Every day | RECTAL | Status: DC | PRN

## 2012-11-23 MED ORDER — DIPHENHYDRAMINE HCL 25 MG PO CAPS: 25.0000 mg | ORAL_CAPSULE | Freq: Four times a day (QID) | ORAL | Status: DC | PRN

## 2012-11-23 MED ORDER — MEASLES, MUMPS & RUBELLA VAC ~~LOC~~ INJ: 0.5000 mL | INJECTION | Freq: Once | SUBCUTANEOUS | Status: DC

## 2012-11-23 MED ORDER — PNEUMOCOCCAL VAC POLYVALENT 25 MCG/0.5ML IJ INJ
0.5000 mL | INJECTION | INTRAMUSCULAR | Status: AC
  Administered 2012-11-24: 0.5 mL via INTRAMUSCULAR
  Filled 2012-11-23: qty 0.5

## 2012-11-23 MED ORDER — BENZOCAINE-MENTHOL 20-0.5 % EX AERO
1.0000 "application " | INHALATION_SPRAY | CUTANEOUS | Status: DC | PRN
  Administered 2012-11-23: 1 via TOPICAL
  Filled 2012-11-23: qty 56

## 2012-11-23 MED ORDER — ONDANSETRON HCL 4 MG/2ML IJ SOLN: 4.0000 mg | INTRAMUSCULAR | Status: DC | PRN

## 2012-11-23 MED ORDER — PRENATAL MULTIVITAMIN CH
1.0000 | ORAL_TABLET | Freq: Every day | ORAL | Status: DC
  Administered 2012-11-23 – 2012-11-24 (×2): 1 via ORAL
  Filled 2012-11-23 (×2): qty 1

## 2012-11-23 MED ORDER — SENNOSIDES-DOCUSATE SODIUM 8.6-50 MG PO TABS
2.0000 | ORAL_TABLET | ORAL | Status: DC
  Administered 2012-11-24: 2 via ORAL
  Filled 2012-11-23: qty 2

## 2012-11-23 MED ORDER — LANOLIN HYDROUS EX OINT: TOPICAL_OINTMENT | CUTANEOUS | Status: DC | PRN

## 2012-11-23 MED ORDER — SIMETHICONE 80 MG PO CHEW: 80.0000 mg | CHEWABLE_TABLET | ORAL | Status: DC | PRN

## 2012-11-23 MED ORDER — TETANUS-DIPHTH-ACELL PERTUSSIS 5-2.5-18.5 LF-MCG/0.5 IM SUSP: 0.5000 mL | Freq: Once | INTRAMUSCULAR | Status: DC

## 2012-11-23 NOTE — Progress Notes (Signed)
S: called to bs by RN secondary hypotension and pt somnolence   O:  Filed Vitals:   11/22/12 2354 11/22/12 2358 11/23/12 0000 11/23/12 0002  BP: 115/53  106/53 108/50  Pulse: 97 101 97 96  Temp:      TempSrc:      Resp:      Height:      Weight:      SpO2:  92%     At about 2350 BP 88/50, repeat 90's/50's  Pt c/o increasing pain on R buttock  Gen: now crying and screaming w pain Pelvic: large hematoma approx 5cm palpated on R sidewall, moderate gush of bleeding,   A: s/p NSVD w partial 3rd degree repair Hematoma BP stable now   P: cbc stat IVF bolus  Continuous 02 sat Fentanyl IVP Dr [name] en route   Allison Sheppard, CNM

## 2012-11-23 NOTE — Progress Notes (Signed)
Post Partum Day 1  Subjective: no complaints, up ad lib, voiding and tolerating PO  Objective: Blood pressure 113/72, pulse 91, temperature 98.3 F (36.8 C), temperature source Oral, resp. rate 18, height 5' 2.5" (1.588 m), weight 282 lb (127.914 kg), last menstrual period 02/12/2012, SpO2 99.00%, unknown if currently breastfeeding.  Physical Exam:  General: alert and no distress Lochia: appropriate Uterine Fundus: firm Incision: NA DVT Evaluation: No evidence of DVT seen on physical exam.  Vaginal pack was removed. Perineum is healing well.   Recent Labs  11/22/12 2345 11/23/12 0345  HGB 10.9* 10.0*  HCT 32.1* 29.2*    Assessment/Plan: Plan for discharge tomorrow, Breastfeeding and Circumcision prior to discharge. Procedure reviewed. MicroNor for contraception (then switch to NuvaRing).   LOS: 1 day   Allison Sheppard V 11/23/2012, 1:33 PM

## 2012-11-23 NOTE — Anesthesia Postprocedure Evaluation (Signed)
  Anesthesia Post-op Note  Patient: Allison Sheppard  Procedure(s) Performed: * No procedures listed *  Patient Location: Mother/Baby  Anesthesia Type:Epidural  Level of Consciousness: awake, alert , oriented and patient cooperative  Airway and Oxygen Therapy: Patient Spontanous Breathing  Post-op Pain: mild  Post-op Assessment: Patient's Cardiovascular Status Stable, Respiratory Function Stable, No headache, No backache, No residual numbness and No residual motor weakness  Post-op Vital Signs: stable  Complications: No apparent anesthesia complications

## 2012-11-24 ENCOUNTER — Ambulatory Visit: Payer: Self-pay

## 2012-11-24 MED ORDER — NORETHINDRONE 0.35 MG PO TABS: 1.0000 | ORAL_TABLET | Freq: Every day | ORAL | Status: DC

## 2012-11-24 MED ORDER — IBUPROFEN 600 MG PO TABS: 600.0000 mg | ORAL_TABLET | Freq: Four times a day (QID) | ORAL | Status: DC

## 2012-11-24 MED ORDER — OXYCODONE-ACETAMINOPHEN 5-325 MG PO TABS: 1.0000 | ORAL_TABLET | ORAL | Status: DC | PRN

## 2012-11-24 NOTE — Lactation Note (Signed)
This note was copied from the chart of Allison Sheppard. Lactation Consultation Note Mother is very motivated to breastfeed. Breast are leaking and she reports breast have been leaking for the last 3 months. Baby is sleepy and not interested in latching. Baby placed skin to skin. Mother to call when baby is showing feeding cues. Patient Name: Allison Sheppard [id number] Date: 11/24/2012     Maternal Data    Feeding    LATCH Score/Interventions                      Lactation Tools Discussed/Used     Consult Status      Allison Sheppard 11/24/2012, 10:30 AM

## 2012-11-24 NOTE — Lactation Note (Signed)
This note was copied from the chart of Allison Sheppard. Lactation Consultation Note Patient is motivated to breastfeed and her breast are leaking. Called to the room because baby is showing cues. Baby is tongue sucking and not maintaining latch on the breast. He latched shallow and fed on and off for 5 minutes. Nipple shield was attempted several times with different sizes but baby did not latch. Mother has an Evenflow pump and a hand pump was instructed. Due to prolonged effort to feed baby without success, mother was instructed to pump and give baby expressed milk per bottle. Baby needs suck training to extend tongue and grasp nipple. Follow up later in the day, patient was already gone. RN said mother was planning to pump once at home. Baby has a ped appointment tomorrow. [facility] appointment scheduled with LC on Tuesday, Nov 11. Patient Name: Allison Galileah Piggee [id number] Date: 11/24/2012     Maternal Data    Feeding    LATCH Score/Interventions                      Lactation Tools Discussed/Used     Consult Status      Omar Person 11/24/2012, 4:40 PM

## 2012-11-24 NOTE — Discharge Summary (Signed)
Vaginal Delivery Discharge Summary  Allison Sheppard  DOB:    02/08/1985 MRN:    [id number] CSN:    [id number]  Date of admission:                  11/22/12  Date of discharge:                   11/24/12  Procedures this admission: SVD with a 3rd degree lac  Date of Delivery: 11/22/12 by Almond Lint  Newborn Data:  Live born female  Birth Weight: 7 lb 3.9 oz (3286 g) APGAR: 9, 9  Home with mother. Circumcision Plan: Completed in hospital  History of Present Illness:  Ms. Allison Sheppard is a 27 y.o. female, G2P1011, who presents at [redacted]w[redacted]d weeks gestation. The patient has been followed at the [facility] and Gynecology division of [facility]. She was admitted onset of labor. Her pregnancy has been complicated by:   Patient Active Problem List   Diagnosis Date Noted  . NSVD (normal spontaneous vaginal delivery) 11/23/2012  . Third degree perineal laceration 11/23/2012  . Hematoma of vagina during delivery 11/23/2012  . Late prenatal care 06/26/2012  . PCOS (polycystic ovarian syndrome) 06/26/2012  . Potential abnormality of glucose tolerance 06/26/2012  . Postural dizziness 06/26/2012     Hospital course:  The patient was admitted for active labor.   Her labor was not complicated. She proceeded to have a vaginal delivery of a healthy infant. Her delivery was not complicated. Her postpartum course was complicated by hematoma, was able to be reduced, pain improved. She was discharged to home on postpartum day 2 doing well.  Feeding:  breast  Contraception:  oral progesterone-only contraceptive, NuvaRing vaginal inserts  Discharge hemoglobin:  Hemoglobin  Date Value Range Status  11/23/2012 10.0* 12.0 - 15.0 g/dL Final     HCT  Date Value Range Status  11/23/2012 29.2* 36.0 - 46.0 % Final    Discharge Physical Exam:   General: alert, cooperative and mild distress d/t BF diffiuculty Lochia: appropriate Uterine Fundus:  firm Incision: healing well DVT Evaluation: No evidence of DVT seen on physical exam. Negative Homan's sign.  Intrapartum Procedures: spontaneous vaginal delivery Postpartum Procedures: none Complications-Operative and Postpartum: 3rd degree perineal laceration and hematoma  Discharge Diagnoses: Term Pregnancy-delivered, Anemia, Hematoma  Discharge Information:  Activity:           pelvic rest Diet:                routine Medications: PNV, Ibuprofen and Percocet Condition:      stable Instructions:   Postpartum Care After Vaginal Delivery  After you deliver your newborn (postpartum period), the usual stay in the hospital is 24 72 hours. If there were problems with your labor or delivery, or if you have other medical problems, you might be in the hospital longer.  While you are in the hospital, you will receive help and instructions on how to care for yourself and your newborn during the postpartum period.  While you are in the hospital:  Be sure to tell your nurses if you have pain or discomfort, as well as where you feel the pain and what makes the pain worse.  If you had an incision made near your vagina (episiotomy) or if you had some tearing during delivery, the nurses may put ice packs on your episiotomy or tear. The ice packs may help to reduce the pain and swelling.  If you are  breastfeeding, you may feel uncomfortable contractions of your uterus for a couple of weeks. This is normal. The contractions help your uterus get back to normal size.  It is normal to have some bleeding after delivery.  For the first 1 3 days after delivery, the flow is red and the amount may be similar to a period.  It is common for the flow to start and stop.  In the first few days, you may pass some small clots. Let your nurses know if you begin to pass large clots or your flow increases.  Do not  flush blood clots down the toilet before having the nurse look at them.  During the next 3 10  days after delivery, your flow should become more watery and pink or brown-tinged in color.  Ten to fourteen days after delivery, your flow should be a small amount of yellowish-white discharge.  The amount of your flow will decrease over the first few weeks after delivery. Your flow may stop in 6 8 weeks. Most women have had their flow stop by 12 weeks after delivery.  You should change your sanitary pads frequently.  Wash your hands thoroughly with soap and water for at least 20 seconds after changing pads, using the toilet, or before holding or feeding your newborn.  You should feel like you need to empty your bladder within the first 6 8 hours after delivery.  In case you become weak, lightheaded, or faint, call your nurse before you get out of bed for the first time and before you take a shower for the first time.  Within the first few days after delivery, your breasts may begin to feel tender and full. This is called engorgement. Breast tenderness usually goes away within 48 72 hours after engorgement occurs. You may also notice milk leaking from your breasts. If you are not breastfeeding, do not stimulate your breasts. Breast stimulation can make your breasts produce more milk.  Spending as much time as possible with your newborn is very important. During this time, you and your newborn can feel close and get to know each other. Having your newborn stay in your room (rooming in) will help to strengthen the bond with your newborn. It will give you time to get to know your newborn and become comfortable caring for your newborn.  Your hormones change after delivery. Sometimes the hormone changes can temporarily cause you to feel sad or tearful. These feelings should not last more than a few days. If these feelings last longer than that, you should talk to your caregiver.  If desired, talk to your caregiver about methods of family planning or contraception.  Talk to your caregiver about  immunizations. Your caregiver may want you to have the following immunizations before leaving the hospital:  Tetanus, diphtheria, and pertussis (Tdap) or tetanus and diphtheria (Td) immunization. It is very important that you and your family (including grandparents) or others caring for your newborn are up-to-date with the Tdap or Td immunizations. The Tdap or Td immunization can help protect your newborn from getting ill.  Rubella immunization.  Varicella (chickenpox) immunization.  Influenza immunization. You should receive this annual immunization if you did not receive the immunization during your pregnancy. Document Released: 11/01/2006 Document Revised: 09/29/2011 Document Reviewed: 09/01/2011 [facility] Patient Information 2014 [place], [place].   Postpartum Depression and Baby Blues  The postpartum period begins right after the birth of a baby. During this time, there is often a great amount of joy and  excitement. It is also a time of considerable changes in the life of the parent(s). Regardless of how many times a mother gives birth, each child brings new challenges and dynamics to the family. It is not unusual to have feelings of excitement accompanied by confusing shifts in moods, emotions, and thoughts. All mothers are at risk of developing postpartum depression or the "baby blues." These mood changes can occur right after giving birth, or they may occur many months after giving birth. The baby blues or postpartum depression can be mild or severe. Additionally, postpartum depression can resolve rather quickly, or it can be a long-term condition. CAUSES Elevated hormones and their rapid decline are thought to be a main cause of postpartum depression and the baby blues. There are a number of hormones that radically change during and after pregnancy. Estrogen and progesterone usually decrease immediately after delivering your baby. The level of thyroid hormone and various cortisol steroids  also rapidly drop. Other factors that play a major role in these changes include major life events and genetics.  RISK FACTORS If you have any of the following risks for the baby blues or postpartum depression, know what symptoms to watch out for during the postpartum period. Risk factors that may increase the likelihood of getting the baby blues or postpartum depression include:  Havinga personal or family history of depression.  Having depression while being pregnant.  Having premenstrual or oral contraceptive-associated mood issues.  Having exceptional life stress.  Having marital conflict.  Lacking a social support network.  Having a baby with special needs.  Having health problems such as diabetes. SYMPTOMS Baby blues symptoms include:  Brief fluctuations in mood, such as going from extreme happiness to sadness.  Decreased concentration.  Difficulty sleeping.  Crying spells, tearfulness.  Irritability.  Anxiety. Postpartum depression symptoms typically begin within the first month after giving birth. These symptoms include:  Difficulty sleeping or excessive sleepiness.  Marked weight loss.  Agitation.  Feelings of worthlessness.  Lack of interest in activity or food. Postpartum psychosis is a very concerning condition and can be dangerous. Fortunately, it is rare. Displaying any of the following symptoms is cause for immediate medical attention. Postpartum psychosis symptoms include:  Hallucinations and delusions.  Bizarre or disorganized behavior.  Confusion or disorientation. DIAGNOSIS  A diagnosis is made by an evaluation of your symptoms. There are no medical or lab tests that lead to a diagnosis, but there are various questionnaires that a caregiver may use to identify those with the baby blues, postpartum depression, or psychosis. Often times, a screening tool called the [place] Postnatal Depression Scale is used to diagnose depression in the  postpartum period.  TREATMENT The baby blues usually goes away on its own in 1 to 2 weeks. Social support is often all that is needed. You should be encouraged to get adequate sleep and rest. Occasionally, you may be given medicines to help you sleep.  Postpartum depression requires treatment as it can last several months or longer if it is not treated. Treatment may include individual or group therapy, medicine, or both to address any social, physiological, and psychological factors that may play a role in the depression. Regular exercise, a healthy diet, rest, and social support may also be strongly recommended.  Postpartum psychosis is more serious and needs treatment right away. Hospitalization is often needed. HOME CARE INSTRUCTIONS  Get as much rest as you can. Nap when the baby sleeps.  Exercise regularly. Some women find yoga and walking  to be beneficial.  Eat a balanced and nourishing diet.  Do little things that you enjoy. Have a cup of tea, take a bubble bath, read your favorite magazine, or listen to your favorite music.  Avoid alcohol.  Ask for help with household chores, cooking, grocery shopping, or running errands as needed. Do not try to do everything.  Talk to people close to you about how you are feeling. Get support from your partner, family members, friends, or other new moms.  Try to stay positive in how you think. Think about the things you are grateful for.  Do not spend a lot of time alone.  Only take medicine as directed by your caregiver.  Keep all your postpartum appointments.  Let your caregiver know if you have any concerns. SEEK MEDICAL CARE IF: You are having a reaction or problems with your medicine. SEEK IMMEDIATE MEDICAL CARE IF:  You have suicidal feelings.  You feel you may harm the baby or someone else. Document Released: 10/09/2003 Document Revised: 03/29/2011 Document Reviewed: 11/10/2010 [facility] Patient Information 2014 [place],  [place].   Discharge to: home  Follow-up Information   Follow up with [facility]. Schedule an appointment as soon as possible for a visit in 5 weeks. (Call with any questions or concerns.)    Specialty:  Obstetrics and Gynecology   Contact information:   [address] [place] [address] [phone]       Haroldine Laws 11/24/2012

## 2012-11-25 LAB — TYPE AND SCREEN: Unit division: 0

## 2012-11-27 ENCOUNTER — Inpatient Hospital Stay (HOSPITAL_COMMUNITY)
Admission: AD | Admit: 2012-11-27 | Discharge: 2012-11-28 | Disposition: A | Discharge status: Home / Self Care | Payer: 59 | Source: Ambulatory Visit | Attending: Obstetrics and Gynecology | Admitting: Obstetrics and Gynecology

## 2012-11-27 ENCOUNTER — Encounter (HOSPITAL_COMMUNITY): Payer: Self-pay

## 2012-11-27 DIAGNOSIS — R51 Headache: Secondary | ICD-10-CM | POA: Insufficient documentation

## 2012-11-27 DIAGNOSIS — R42 Dizziness and giddiness: Secondary | ICD-10-CM | POA: Insufficient documentation

## 2012-11-27 DIAGNOSIS — N949 Unspecified condition associated with female genital organs and menstrual cycle: Secondary | ICD-10-CM | POA: Insufficient documentation

## 2012-11-27 DIAGNOSIS — O99893 Other specified diseases and conditions complicating puerperium: Secondary | ICD-10-CM | POA: Insufficient documentation

## 2012-11-27 LAB — URINALYSIS, ROUTINE W REFLEX MICROSCOPIC
Glucose, UA: NEGATIVE mg/dL
Ketones, ur: 15 mg/dL — AB
Nitrite: NEGATIVE
Specific Gravity, Urine: 1.025 (ref 1.005–1.030)
Urobilinogen, UA: 0.2 mg/dL (ref 0.0–1.0)
pH: 5.5 (ref 5.0–8.0)

## 2012-11-27 LAB — COMPREHENSIVE METABOLIC PANEL
Albumin: 2.4 g/dL — ABNORMAL LOW (ref 3.5–5.2)
Alkaline Phosphatase: 80 U/L (ref 39–117)
BUN: 8 mg/dL (ref 6–23)
Calcium: 8.5 mg/dL (ref 8.4–10.5)
Chloride: 104 mEq/L (ref 96–112)
GFR calc Af Amer: >90 mL/min (ref >90)
Glucose, Bld: 103 mg/dL — ABNORMAL HIGH (ref 70–99)
Potassium: 3.6 mEq/L (ref 3.5–5.1)
Sodium: 138 mEq/L (ref 135–145)
Total Bilirubin: 0.3 mg/dL (ref 0.3–1.2)

## 2012-11-27 LAB — CBC
HCT: 25.8 % — ABNORMAL LOW (ref 36.0–46.0)
Hemoglobin: 8.4 g/dL — ABNORMAL LOW (ref 12.0–15.0)
MCHC: 32.6 g/dL (ref 30.0–36.0)
MCV: 84.6 fL (ref 78.0–100.0)
RBC: 3.05 MIL/uL — ABNORMAL LOW (ref 3.87–5.11)
RDW: 15 % (ref 11.5–15.5)
WBC: 14.8 10*3/uL — ABNORMAL HIGH (ref 4.0–10.5)

## 2012-11-27 LAB — URINE MICROSCOPIC-ADD ON

## 2012-11-27 LAB — LACTATE DEHYDROGENASE: LDH: 268 U/L — ABNORMAL HIGH (ref 94–250)

## 2012-11-27 MED ORDER — BUTALBITAL-APAP-CAFFEINE 50-325-40 MG PO TABS
2.0000 | ORAL_TABLET | Freq: Once | ORAL | Status: AC
  Administered 2012-11-27: 2 via ORAL
  Filled 2012-11-27: qty 2

## 2012-11-27 NOTE — MAU Note (Signed)
Pt states that for the past 3 days she has felt dizzy and out of it. States she has a headache. Also c/o numbness on the right side of her butt.

## 2012-11-28 ENCOUNTER — Ambulatory Visit (HOSPITAL_COMMUNITY)
Admit: 2012-11-28 | Discharge: 2012-11-28 | Disposition: A | Discharge status: Home / Self Care | Payer: 59 | Attending: Obstetrics and Gynecology | Admitting: Obstetrics and Gynecology

## 2012-11-28 LAB — PROTEIN / CREATININE RATIO, URINE
Protein Creatinine Ratio: 0.11 (ref 0.00–0.15)
Total Protein, Urine: 16.8 mg/dL

## 2012-11-28 LAB — URINE CULTURE: Culture: NO GROWTH

## 2012-11-28 MED ORDER — IRON POLYSACCH CMPLX-B12-FA 150-0.025-1 MG PO CAPS: 1.0000 | ORAL_CAPSULE | Freq: Two times a day (BID) | ORAL | Status: DC

## 2012-11-28 NOTE — MAU Provider Note (Signed)
History     CSN: [id number]  Arrival date and time: 11/27/12 2211   None     No chief complaint on file.  HPI Comments: Pt is a G2P1 PPD5 following a  NSVD, w partial 3rd degree laceration and R side wall hematoma that resolved without surgical intervention. She c/o today of a HA, and feeling more weak and some dizziness. Also c/o vaginal pain. She denies N/V/RUQ pain, hasn't slept well, is BF well, also feels like feet have more swelling the last couple of days.        Past Medical History  Diagnosis Date  . Elective abortion   . Abnormal Pap smear   . Chlamydia     Past Surgical History  Procedure Laterality Date  . No past surgeries      Family History  Problem Relation Age of Onset  . Other Neg Hx     History  Substance Use Topics  . Smoking status: Former Smoker -- 0.50 packs/day    Types: Cigarettes    Quit date: 10/05/2011  . Smokeless tobacco: Never Used  . Alcohol Use: No    Allergies: No Known Allergies  Prescriptions prior to admission  Medication Sig Dispense Refill  . docusate sodium (COLACE) 100 MG capsule Take 100 mg by mouth 2 (two) times daily.      Marland Kitchen ibuprofen (ADVIL,MOTRIN) 600 MG tablet Take 1 tablet (600 mg total) by mouth every 6 (six) hours.  30 tablet  0  . norethindrone (ORTHO MICRONOR) 0.35 MG tablet Take 1 tablet (0.35 mg total) by mouth daily.  1 Package  11  . oxyCODONE-acetaminophen (PERCOCET/ROXICET) 5-325 MG per tablet Take 1-2 tablets by mouth every 4 (four) hours as needed for severe pain (moderate - severe pain).  30 tablet  0  . Prenatal Vit-Min-FA-Fish Oil ([employer] PRENATAL GUMMY PO) Take 2 tablets by mouth daily.        Review of Systems  Respiratory: Negative for shortness of breath.   Cardiovascular: Negative for chest pain.  Gastrointestinal: Negative for nausea, vomiting and abdominal pain.  Neurological: Positive for headaches.  All other systems reviewed and are negative.   Physical Exam   Blood pressure  110/79, pulse 101, temperature 98 F (36.7 C), temperature source Oral, resp. rate 18, last menstrual period 02/12/2012, SpO2 99.00%.  Physical Exam  Nursing note and vitals reviewed. Constitutional: She is oriented to person, place, and time. She appears well-developed and well-nourished. No distress.  HENT:  Head: Normocephalic.  Eyes: Pupils are equal, round, and reactive to light.  Neck: Normal range of motion.  Cardiovascular: Normal rate, regular rhythm and normal heart sounds.   Respiratory: Effort normal and breath sounds normal.  GI: Soft. Bowel sounds are normal.  Genitourinary: Vagina normal.  Internal exam deferred  Musculoskeletal: Normal range of motion.  Neurological: She is alert and oriented to person, place, and time. She has normal reflexes.  Skin: Skin is warm and dry.  Psychiatric: She has a normal mood and affect. Her behavior is normal.   Results for orders placed during the hospital encounter of 11/27/12 (from the past 24 hour(s))  URINALYSIS, ROUTINE W REFLEX MICROSCOPIC     Status: Abnormal   Collection Time    11/27/12 10:15 PM      Result Value Range   Color, Urine YELLOW  YELLOW   APPearance TURBID (*) CLEAR   Specific Gravity, Urine 1.025  1.005 - 1.030   pH 5.5  5.0 - 8.0  Glucose, UA NEGATIVE  NEGATIVE mg/dL   Hgb urine dipstick LARGE (*) NEGATIVE   Bilirubin Urine NEGATIVE  NEGATIVE   Ketones, ur 15 (*) NEGATIVE mg/dL   Protein, ur 30 (*) NEGATIVE mg/dL   Urobilinogen, UA 0.2  0.0 - 1.0 mg/dL   Nitrite NEGATIVE  NEGATIVE   Leukocytes, UA MODERATE (*) NEGATIVE  URINE MICROSCOPIC-ADD ON     Status: Abnormal   Collection Time    11/27/12 10:15 PM      Result Value Range   Squamous Epithelial / LPF RARE  RARE   WBC, UA 3-6  <3 WBC/hpf   RBC / HPF TOO NUMEROUS TO COUNT  <3 RBC/hpf   Bacteria, UA MANY (*) RARE  CBC     Status: Abnormal   Collection Time    11/27/12 11:00 PM      Result Value Range   WBC 14.8 (*) 4.0 - 10.5 K/uL   RBC 3.05  (*) 3.87 - 5.11 MIL/uL   Hemoglobin 8.4 (*) 12.0 - 15.0 g/dL   HCT 16.1 (*) 09.6 - 04.5 %   MCV 84.6  78.0 - 100.0 fL   MCH 27.5  26.0 - 34.0 pg   MCHC 32.6  30.0 - 36.0 g/dL   RDW 40.9  81.1 - 91.4 %   Platelets 369  150 - 400 K/uL  COMPREHENSIVE METABOLIC PANEL     Status: Abnormal   Collection Time    11/27/12 11:00 PM      Result Value Range   Sodium 138  135 - 145 mEq/L   Potassium 3.6  3.5 - 5.1 mEq/L   Chloride 104  96 - 112 mEq/L   CO2 25  19 - 32 mEq/L   Glucose, Bld 103 (*) 70 - 99 mg/dL   BUN 8  6 - 23 mg/dL   Creatinine, Ser 7.82  0.50 - 1.10 mg/dL   Calcium 8.5  8.4 - 95.6 mg/dL   Total Protein 6.2  6.0 - 8.3 g/dL   Albumin 2.4 (*) 3.5 - 5.2 g/dL   AST 22  0 - 37 U/L   ALT 18  0 - 35 U/L   Alkaline Phosphatase 80  39 - 117 U/L   Total Bilirubin 0.3  0.3 - 1.2 mg/dL   GFR calc non Af Amer >90  >90 mL/min   GFR calc Af Amer >90  >90 mL/min  LACTATE DEHYDROGENASE     Status: Abnormal   Collection Time    11/27/12 11:00 PM      Result Value Range   LDH 268 (*) 94 - 250 U/L  URIC ACID     Status: Abnormal   Collection Time    11/27/12 11:00 PM      Result Value Range   Uric Acid, Serum 7.7 (*) 2.4 - 7.0 mg/dL  PROTEIN / CREATININE RATIO, URINE     Status: None   Collection Time    11/27/12 11:30 PM      Result Value Range   Creatinine, Urine 156.01     Total Protein, Urine 16.8     PROTEIN CREATININE RATIO 0.11  0.00 - 0.15    MAU Course  Procedures    Assessment and Plan  PPD5, s/p NSVD, w r side wall hematoma, that did not require surgical intervention HA resolved PIH labs normal, anemia, noted Prot/creat ratio, pending  Will d/c home and call if PCR elevated Keep routine f/u at office  rx for niferex FE suppl BID,  alternatively, I recommended pt take floridex BID   PCR was normal  D/w Dr Raymondo Band 11/28/2012, 12:49 AM

## 2012-11-28 NOTE — Lactation Note (Signed)
Infant Lactation Consultation Outpatient Visit Note  Patient Name: Allison Sheppard Date of Birth: [birth year] Birth Weight:  7 #3 oz Gestational Age at Delivery: Gestational Age: <None> Type of Delivery: vaginal  Breastfeeding History Frequency of Breastfeeding: [name] is currently being fed BM in a bottle because he was only staying latched on for 5 minutes at a time while in the hospital Length of Feeding:  Voids:  Stools:   Supplementing / Method: Pumping:  Type of Pump:evenflo but expecting a hygeia pump tomorrow.   Frequency:pumping 4 times daily for 20-30 minutes  Volume:  5 oz  Comments:    Consultation Evaluation:  Initial Feeding Assessment: +Pre-feed Weight:3250 7+2oz Post-feed Weight: Amount Transferred: Comments:Attempted to latch Allison Sheppard to the bare breast and with a nipple shield without success.  Mom has large nipples and areolar edema. Allison Sheppard has been bottle feeding and is not open his mouth wide.  He does have tongue undulations when a gloved finger is inserted deeply into his mouth. Tongue exercises were done with BM in a bottle using SL and paced feeding.  This was relaxing for him.  After this he was able to latch to the NS with flanged lips and a wide mouth but he did not swallow consistently.  This however was a great improvement over the previous attempt.  Mom is very committed to bringing Allison Sheppard to the breast.  SHe will continue to offer the breast and follow- it with a supplement of 2-3 ounces until we are sure that he is transferring milk.  Advised her to pump for 15 minutes after feeding.  She may not be to fully commit to pumping 8 times a day as her blood pressure has been an issue and she is to be resting.  She plans to have her boyfriend help her as much as possible.  Follow-up as an outpatient on Monday Nov 17 @ 4 pm  :  Total Breast milk Transferred this Visit:  Total Supplement Given: 30  Additional Interventions:   Follow-Up      Soyla Dryer 11/28/2012, 4:07 PM

## 2012-12-01 ENCOUNTER — Encounter (HOSPITAL_COMMUNITY): Payer: Self-pay | Admitting: General Practice

## 2012-12-01 ENCOUNTER — Inpatient Hospital Stay (HOSPITAL_COMMUNITY)
Admission: AD | Admit: 2012-12-01 | Discharge: 2012-12-02 | Disposition: A | Discharge status: Home / Self Care | Payer: 59 | Source: Ambulatory Visit | Attending: Obstetrics and Gynecology | Admitting: Obstetrics and Gynecology

## 2012-12-01 DIAGNOSIS — N949 Unspecified condition associated with female genital organs and menstrual cycle: Secondary | ICD-10-CM | POA: Insufficient documentation

## 2012-12-01 DIAGNOSIS — O909 Complication of the puerperium, unspecified: Secondary | ICD-10-CM | POA: Insufficient documentation

## 2012-12-01 LAB — CBC WITH DIFFERENTIAL/PLATELET
Basophils Absolute: 0.1 10*3/uL (ref 0.0–0.1)
HCT: 24.6 % — ABNORMAL LOW (ref 36.0–46.0)
Hemoglobin: 8.2 g/dL — ABNORMAL LOW (ref 12.0–15.0)
Lymphocytes Relative: 27 % (ref 12–46)
Lymphs Abs: 3.2 10*3/uL (ref 0.7–4.0)
Monocytes Absolute: 0.9 10*3/uL (ref 0.1–1.0)
Neutro Abs: 7.4 10*3/uL (ref 1.7–7.7)
Neutrophils Relative %: 63 % (ref 43–77)
Platelets: 502 10*3/uL — ABNORMAL HIGH (ref 150–400)
RBC: 3.01 MIL/uL — ABNORMAL LOW (ref 3.87–5.11)
RDW: 15.1 % (ref 11.5–15.5)
WBC: 11.7 10*3/uL — ABNORMAL HIGH (ref 4.0–10.5)

## 2012-12-01 MED ORDER — OXYCODONE-ACETAMINOPHEN 5-325 MG PO TABS
2.0000 | ORAL_TABLET | Freq: Four times a day (QID) | ORAL | Status: DC | PRN
  Administered 2012-12-01: 2 via ORAL
  Filled 2012-12-01: qty 2

## 2012-12-01 NOTE — MAU Note (Signed)
Pt presents to MAU with c/o pain/tenderness in perineal area 10 days after delivery. Pt states she had a 3rd degree tear. Pt states she is passing small clots after using the bathroom.

## 2012-12-01 NOTE — MAU Note (Signed)
Had vaginal delivery 11/5 with 3rd degree tear. Also had blood clots. Pain medicine helps but when it wears off I'm hurting again just like i was when I  Was in the hospital.

## 2012-12-01 NOTE — MAU Provider Note (Signed)
History   27yo, G2P1011 at PP day 10 following NSVD with a 3rd degree lac and right vaginal wall hematoma that did not require surgical intervention.  Pt reports that pain medication gives relief but when it wears off the pain is the same as PP day 1.  She feels that "her laceration is not healing."  Denies recent fever, resp or GI c/o's, UTI or PIH s/s.   Chief Complaint  Patient presents with  . Vaginal Pain   HPI  OB History   Grav Para Term Preterm Abortions TAB SAB Ect Mult Living   2 1 1  1 1    1       Past Medical History  Diagnosis Date  . Elective abortion   . Abnormal Pap smear   . Chlamydia     Past Surgical History  Procedure Laterality Date  . No past surgeries      Family History  Problem Relation Age of Onset  . Other Neg Hx     History  Substance Use Topics  . Smoking status: Former Smoker -- 0.50 packs/day    Types: Cigarettes    Quit date: 10/05/2011  . Smokeless tobacco: Never Used  . Alcohol Use: No    Allergies: No Known Allergies  Prescriptions prior to admission  Medication Sig Dispense Refill  . docusate sodium (COLACE) 100 MG capsule Take 100 mg by mouth 2 (two) times daily.      Marland Kitchen ibuprofen (ADVIL,MOTRIN) 600 MG tablet Take 1 tablet (600 mg total) by mouth every 6 (six) hours.  30 tablet  0  . Iron Polysacch Cmplx-B12-FA 150-0.025-1 MG CAPS Take 1 tablet by mouth 2 (two) times daily.  60 each  0  . norethindrone (ORTHO MICRONOR) 0.35 MG tablet Take 1 tablet (0.35 mg total) by mouth daily.  1 Package  11  . oxyCODONE-acetaminophen (PERCOCET/ROXICET) 5-325 MG per tablet Take 1-2 tablets by mouth every 4 (four) hours as needed for severe pain (moderate - severe pain).  30 tablet  0  . Prenatal Vit-Min-FA-Fish Oil ([employer] PRENATAL GUMMY PO) Take 2 tablets by mouth daily.        ROS ROS: see HPI above, all other systems are negative  Physical Exam   Blood pressure 159/92, pulse 90, temperature 99.6 F (37.6 C), resp. rate 20, height 5\' 3"   (1.6 m), weight 122.108 kg (269 lb 3.2 oz), last menstrual period 02/12/2012.  Physical Exam  Constitutional: She is oriented to person, place, and time. She appears well-developed and well-nourished.  HENT:  Head: Normocephalic.  Neck: Normal range of motion.  Cardiovascular: Normal rate and regular rhythm.   Respiratory: Effort normal.  GI: Soft.  Musculoskeletal: Normal range of motion.  Neurological: She is alert and oriented to person, place, and time.  Skin: Skin is warm and dry.  Psychiatric: She has a normal mood and affect. Her behavior is normal.   Pelvic exam:  VULVA: normal appearing vulva with no masses, or lesions some tenderness around perineum and introitus PERINEUM: laceration is not well approximated with pink tissue noted, was not bleeding, no erythema or purulent discharge noted from laceration; tenderness noted VAGINA: vaginal tenderness is noted, vaginal discharge - moderate amount of bloody, malodorous and milky, laceration is well approximated, no hematoma noted  UTERUS: uterus is normal size, shape, consistency and nontender, exam chaperoned by RN    ED Course  PP day 10 Laceration pain  Wound culture CBC with diff Wet prep  C/w Dr. Charlotta Newton Percocet  refilled Keflex 500 mg BID x 7 days Sitz bath sent home for use F/u in office on Monday      Haroldine Laws CNM, MSN 12/01/2012 10:39 PM

## 2012-12-01 NOTE — MAU Note (Signed)
Pt states she was here on Monday for the same thing and that her pain is not any better. She states that she feels the same as if she just had the baby.

## 2012-12-02 LAB — WET PREP, GENITAL: Trich, Wet Prep: NONE SEEN

## 2012-12-02 MED ORDER — CEPHALEXIN 500 MG PO CAPS: 500.0000 mg | ORAL_CAPSULE | Freq: Two times a day (BID) | ORAL | Status: DC

## 2012-12-02 MED ORDER — OXYCODONE-ACETAMINOPHEN 5-325 MG PO TABS: 2.0000 | ORAL_TABLET | Freq: Four times a day (QID) | ORAL | Status: DC | PRN

## 2012-12-04 ENCOUNTER — Ambulatory Visit (HOSPITAL_COMMUNITY): Admission: RE | Admit: 2012-12-04 | Payer: 59 | Source: Ambulatory Visit

## 2012-12-04 LAB — WOUND CULTURE

## 2013-11-19 ENCOUNTER — Encounter (HOSPITAL_COMMUNITY): Payer: Self-pay | Admitting: General Practice

## 2014-08-01 DIAGNOSIS — A6000 Herpesviral infection of urogenital system, unspecified: Secondary | ICD-10-CM | POA: Insufficient documentation

## 2015-01-17 ENCOUNTER — Encounter (HOSPITAL_COMMUNITY): Payer: Self-pay | Admitting: *Deleted

## 2015-01-17 ENCOUNTER — Emergency Department (INDEPENDENT_AMBULATORY_CARE_PROVIDER_SITE_OTHER)
Admission: EM | Admit: 2015-01-17 | Discharge: 2015-01-17 | Disposition: A | Discharge status: Home / Self Care | Payer: 59 | Source: Home / Self Care | Attending: Family Medicine | Admitting: Family Medicine

## 2015-01-17 DIAGNOSIS — J111 Influenza due to unidentified influenza virus with other respiratory manifestations: Secondary | ICD-10-CM

## 2015-01-17 DIAGNOSIS — R69 Illness, unspecified: Principal | ICD-10-CM

## 2015-01-17 MED ORDER — AZITHROMYCIN 250 MG PO TABS: ORAL_TABLET | ORAL | Status: DC

## 2015-01-17 MED ORDER — IPRATROPIUM BROMIDE 0.06 % NA SOLN: 2.0000 | Freq: Four times a day (QID) | NASAL | Status: DC

## 2015-01-17 NOTE — Discharge Instructions (Signed)
Take all of medicine, drink lots of fluids, no more smoking, see your doctor if further problems  °

## 2015-01-17 NOTE — ED Provider Notes (Signed)
CSN: [id number]     Arrival date & time 01/17/15  1309 History   First MD Initiated Contact with Patient 01/17/15 1435     Chief Complaint  Patient presents with  . URI   (Consider location/radiation/quality/duration/timing/severity/associated sxs/prior Treatment) Patient is a 29 y.o. female presenting with URI. The history is provided by the patient.  URI Presenting symptoms: congestion, cough, ear pain and rhinorrhea   Severity:  Moderate Onset quality:  Gradual Duration:  5 days Progression:  Worsening Chronicity:  New Exacerbated by: smoking. Ineffective treatments:  None tried Associated symptoms: myalgias   Risk factors: sick contacts     Past Medical History  Diagnosis Date  . Elective abortion   . Abnormal Pap smear   . Chlamydia    Past Surgical History  Procedure Laterality Date  . No past surgeries     Family History  Problem Relation Age of Onset  . Other Neg Hx    Social History  Substance Use Topics  . Smoking status: Former Smoker -- 0.50 packs/day    Types: Cigarettes    Quit date: 10/05/2011  . Smokeless tobacco: Never Used  . Alcohol Use: No   OB History    Gravida Para Term Preterm AB TAB SAB Ectopic Multiple Living   2 1 1  1 1    1      Review of Systems  Constitutional: Positive for chills.  HENT: Positive for congestion, ear pain and rhinorrhea.   Respiratory: Positive for cough.   Cardiovascular: Negative.   Gastrointestinal: Negative.   Musculoskeletal: Positive for myalgias.  All other systems reviewed and are negative.   Allergies  Review of patient's allergies indicates no known allergies.  Home Medications   Prior to Admission medications   Medication Sig Start Date End Date Taking? Authorizing Provider  azithromycin (ZITHROMAX Z-PAK) 250 MG tablet Take as directed on pack 01/17/15   Linna HoffJames D Lucky Trotta, MD  cephALEXin (KEFLEX) 500 MG capsule Take 1 capsule (500 mg total) by mouth 2 (two) times daily. 12/02/12   Haroldine LawsJennifer Oxley,  CNM  docusate sodium (COLACE) 100 MG capsule Take 100 mg by mouth 2 (two) times daily.    Historical Provider, MD  ibuprofen (ADVIL,MOTRIN) 600 MG tablet Take 1 tablet (600 mg total) by mouth every 6 (six) hours. 11/24/12   Haroldine LawsJennifer Oxley, CNM  ipratropium (ATROVENT) 0.06 % nasal spray Place 2 sprays into both nostrils 4 (four) times daily. 01/17/15   Linna HoffJames D Warren Kugelman, MD  Iron Polysacch Cmplx-B12-FA 150-0.025-1 MG CAPS Take 1 tablet by mouth 2 (two) times daily. 11/28/12   Sanda KleinShelley Lillard, CNM  norethindrone (ORTHO MICRONOR) 0.35 MG tablet Take 1 tablet (0.35 mg total) by mouth daily. 11/24/12   Haroldine LawsJennifer Oxley, CNM  oxyCODONE-acetaminophen (PERCOCET/ROXICET) 5-325 MG per tablet Take 2 tablets by mouth every 6 (six) hours as needed for severe pain. 12/02/12   Haroldine LawsJennifer Oxley, CNM  Prenatal Vit-Min-FA-Fish Oil ([employer] PRENATAL GUMMY PO) Take 2 tablets by mouth daily.    Historical Provider, MD   Meds Ordered and Administered this Visit  Medications - No data to display  BP 114/68 mmHg  Pulse 86  Temp(Src) 99.1 F (37.3 C) (Oral)  Resp 16  SpO2 100%  LMP 01/04/2015 No data found.   Physical Exam  Constitutional: She is oriented to person, place, and time. She appears well-developed and well-nourished. No distress.  HENT:  Right Ear: External ear normal.  Left Ear: External ear normal.  Mouth/Throat: Oropharynx is clear and moist.  Eyes: Pupils are equal, round, and reactive to light.  Neck: Normal range of motion. Neck supple.  Cardiovascular: Normal heart sounds and intact distal pulses.   Pulmonary/Chest: Effort normal and breath sounds normal.  Abdominal: Soft. Bowel sounds are normal.  Lymphadenopathy:    She has no cervical adenopathy.  Neurological: She is alert and oriented to person, place, and time.  Skin: Skin is warm and dry.  Nursing note and vitals reviewed.   ED Course  Procedures (including critical care time)  Labs Review Labs Reviewed - No data to display  Imaging  Review No results found.   Visual Acuity Review  Right Eye Distance:   Left Eye Distance:   Bilateral Distance:    Right Eye Near:   Left Eye Near:    Bilateral Near:         MDM   1. Influenza-like illness        Linna Hoff, MD 01/17/15 [phone]

## 2015-01-17 NOTE — ED Notes (Signed)
Pt  Reports  Symptoms  Of  Body  Aches   Chills  Headache        Nausea         With  sensaton of  Decreased  Hearing      Onset   Of  Symptoms  X  4  Days     Pt sitting upright on the  Exam table  Speaking  In   Complete  sentances and is in no  Acute  Distress

## 2015-02-24 ENCOUNTER — Other Ambulatory Visit: Payer: Self-pay | Admitting: Orthopedic Surgery

## 2015-02-24 DIAGNOSIS — M25511 Pain in right shoulder: Secondary | ICD-10-CM

## 2015-03-01 ENCOUNTER — Ambulatory Visit
Admission: RE | Admit: 2015-03-01 | Discharge: 2015-03-01 | Disposition: A | Discharge status: Home / Self Care | Payer: 59 | Source: Ambulatory Visit | Attending: Orthopedic Surgery | Admitting: Orthopedic Surgery

## 2015-03-01 DIAGNOSIS — M25511 Pain in right shoulder: Secondary | ICD-10-CM

## 2015-04-02 ENCOUNTER — Encounter (HOSPITAL_BASED_OUTPATIENT_CLINIC_OR_DEPARTMENT_OTHER): Payer: Self-pay

## 2015-04-02 ENCOUNTER — Emergency Department (HOSPITAL_BASED_OUTPATIENT_CLINIC_OR_DEPARTMENT_OTHER)
Admission: EM | Admit: 2015-04-02 | Discharge: 2015-04-02 | Disposition: A | Discharge status: Home / Self Care | Payer: 59 | Attending: Emergency Medicine | Admitting: Emergency Medicine

## 2015-04-02 ENCOUNTER — Emergency Department (HOSPITAL_BASED_OUTPATIENT_CLINIC_OR_DEPARTMENT_OTHER): Payer: 59

## 2015-04-02 DIAGNOSIS — Z792 Long term (current) use of antibiotics: Secondary | ICD-10-CM | POA: Insufficient documentation

## 2015-04-02 DIAGNOSIS — Z79899 Other long term (current) drug therapy: Secondary | ICD-10-CM | POA: Diagnosis not present

## 2015-04-02 DIAGNOSIS — J111 Influenza due to unidentified influenza virus with other respiratory manifestations: Secondary | ICD-10-CM | POA: Insufficient documentation

## 2015-04-02 DIAGNOSIS — H7493 Unspecified disorder of middle ear and mastoid, bilateral: Secondary | ICD-10-CM | POA: Diagnosis not present

## 2015-04-02 DIAGNOSIS — M791 Myalgia: Secondary | ICD-10-CM | POA: Insufficient documentation

## 2015-04-02 DIAGNOSIS — F17210 Nicotine dependence, cigarettes, uncomplicated: Secondary | ICD-10-CM | POA: Diagnosis not present

## 2015-04-02 DIAGNOSIS — Z8619 Personal history of other infectious and parasitic diseases: Secondary | ICD-10-CM | POA: Insufficient documentation

## 2015-04-02 DIAGNOSIS — R05 Cough: Secondary | ICD-10-CM | POA: Diagnosis present

## 2015-04-02 DIAGNOSIS — R69 Illness, unspecified: Secondary | ICD-10-CM

## 2015-04-02 LAB — RAPID STREP SCREEN (MED CTR MEBANE ONLY): Streptococcus, Group A Screen (Direct): NEGATIVE

## 2015-04-02 MED ORDER — BENZONATATE 100 MG PO CAPS: 100.0000 mg | ORAL_CAPSULE | Freq: Three times a day (TID) | ORAL | Status: DC | PRN

## 2015-04-02 MED ORDER — ACETAMINOPHEN 325 MG PO TABS
650.0000 mg | ORAL_TABLET | Freq: Once | ORAL | Status: AC
  Administered 2015-04-02: 650 mg via ORAL
  Filled 2015-04-02: qty 2

## 2015-04-02 MED ORDER — NAPROXEN 500 MG PO TABS: 500.0000 mg | ORAL_TABLET | Freq: Two times a day (BID) | ORAL | Status: DC

## 2015-04-02 MED ORDER — CETIRIZINE HCL 10 MG PO TABS: 10.0000 mg | ORAL_TABLET | Freq: Every day | ORAL | Status: DC

## 2015-04-02 NOTE — Discharge Instructions (Signed)

## 2015-04-02 NOTE — ED Notes (Signed)
Pt reports 2 week history of generalized weakness, fatigue, body aches, cough, sore throat, headache.

## 2015-04-02 NOTE — ED Provider Notes (Signed)
CSN: [id number]     Arrival date & time 04/02/15  1723 History  By signing my name below, I, Marisue Humble, attest that this documentation has been prepared under the direction and in the presence of non-physician practitioner, Everlene Farrier, PA-C. Electronically Signed: Marisue Humble, Scribe. 04/02/2015. 6:23 PM.   Chief Complaint  Patient presents with  . Cough   The history is provided by the patient. No language interpreter was used.   HPI Comments:  Allison Sheppard is a 30 y.o. female with no pertinent PMHx who presents to the Emergency Department complaining of persistent dry cough for the past two weeks. Pt reports associated generalized body aches, fatigue, sore throat, painful swallowing, wheezing, headache, sneezing, rhinorrhea, post nasal drip. She states she took mucous medication with mild relief; denies taking any Tylenol or ibuprofen.  Pt denies flu shot. Denies h/o asthma, nausea, vomiting, diarrhea, abdominal pain, rash, difficulty swallowing, or fever.  Past Medical History  Diagnosis Date  . Elective abortion   . Abnormal Pap smear   . Chlamydia    Past Surgical History  Procedure Laterality Date  . No past surgeries     Family History  Problem Relation Age of Onset  . Other Neg Hx    Social History  Substance Use Topics  . Smoking status: Current Every Day Smoker -- 0.50 packs/day    Types: Cigarettes    Last Attempt to Quit: 10/05/2011  . Smokeless tobacco: Never Used  . Alcohol Use: No   OB History    Gravida Para Term Preterm AB TAB SAB Ectopic Multiple Living   Review of Systems  Constitutional: Positive for fatigue. Negative for fever.  HENT: Positive for congestion, postnasal drip, rhinorrhea, sneezing and sore throat. Negative for ear discharge, ear pain, mouth sores and trouble swallowing.   Respiratory: Positive for cough and wheezing. Negative for chest tightness and shortness of breath.   Cardiovascular: Negative for  chest pain.  Gastrointestinal: Negative for nausea, vomiting, abdominal pain and diarrhea.  Genitourinary: Negative for dysuria.  Musculoskeletal: Positive for myalgias (generalized). Negative for neck pain and neck stiffness.  Skin: Negative for rash.  Neurological: Positive for headaches. Negative for syncope and light-headedness.  All other systems reviewed and are negative.  Allergies  Review of patient's allergies indicates no known allergies.  Home Medications   Prior to Admission medications   Medication Sig Start Date End Date Taking? Authorizing Provider  etonogestrel-ethinyl estradiol (NUVARING) 0.12-0.015 MG/24HR vaginal ring Place 1 each vaginally every 28 (twenty-eight) days. Insert vaginally and leave in place for 3 consecutive weeks, then remove for 1 week.   Yes Historical Provider, MD  azithromycin (ZITHROMAX Z-PAK) 250 MG tablet Take as directed on pack 01/17/15   Linna Hoff, MD  benzonatate (TESSALON) 100 MG capsule Take 1 capsule (100 mg total) by mouth 3 (three) times daily as needed for cough. 04/02/15   Everlene Farrier, PA-C  cephALEXin (KEFLEX) 500 MG capsule Take 1 capsule (500 mg total) by mouth 2 (two) times daily. 12/02/12   Haroldine Laws, CNM  cetirizine (ZYRTEC ALLERGY) 10 MG tablet Take 1 tablet (10 mg total) by mouth daily. 04/02/15   Everlene Farrier, PA-C  docusate sodium (COLACE) 100 MG capsule Take 100 mg by mouth 2 (two) times daily.    Historical Provider, MD  ibuprofen (ADVIL,MOTRIN) 600 MG tablet Take 1 tablet (600 mg total) by mouth every 6 (six) hours. 11/24/12  Haroldine Laws, CNM  ipratropium (ATROVENT) 0.06 % nasal spray Place 2 sprays into both nostrils 4 (four) times daily. 01/17/15   Linna Hoff, MD  Iron Polysacch Cmplx-B12-FA 150-0.025-1 MG CAPS Take 1 tablet by mouth 2 (two) times daily. 11/28/12   Sanda Klein, CNM  naproxen (NAPROSYN) 500 MG tablet Take 1 tablet (500 mg total) by mouth 2 (two) times daily with a meal. 04/02/15   Everlene Farrier, PA-C  norethindrone (ORTHO MICRONOR) 0.35 MG tablet Take 1 tablet (0.35 mg total) by mouth daily. 11/24/12   Haroldine Laws, CNM  oxyCODONE-acetaminophen (PERCOCET/ROXICET) 5-325 MG per tablet Take 2 tablets by mouth every 6 (six) hours as needed for severe pain. 12/02/12   Haroldine Laws, CNM  Prenatal Vit-Min-FA-Fish Oil ([employer] PRENATAL GUMMY PO) Take 2 tablets by mouth daily.    Historical Provider, MD   BP 140/78 mmHg  Pulse 89  Temp(Src) 99.9 F (37.7 C) (Oral)  Resp 18  Ht  (1.575 m)  Wt 118.842 kg  BMI 47.91 kg/m2  SpO2 98%  LMP 03/07/2015 Physical Exam  Constitutional: She is oriented to person, place, and time. She appears well-developed and well-nourished. No distress.  Nontoxic appearing.  HENT:  Head: Normocephalic and atraumatic.  Right Ear: External ear normal.  Left Ear: External ear normal.  Mouth/Throat: Oropharynx is clear and moist. No oropharyngeal exudate.  Middle ear effusion bilaterally. no TM erythema or loss of landmarks BL; boggy nasal turbinates BL; mild BL tonsillar hypertrophy without exudates.  Utilizing midline without edema. No peritonsillar abscess. No trismus. No drooling.  Eyes: Conjunctivae are normal. Pupils are equal, round, and reactive to light. Right eye exhibits no discharge. Left eye exhibits no discharge.  Neck: Normal range of motion. Neck supple. No JVD present. No tracheal deviation present.  Good and normal range of motion of her neck. No meningeal signs.  Cardiovascular: Normal rate, regular rhythm, normal heart sounds and intact distal pulses.  Exam reveals no gallop and no friction rub.   No murmur heard. Pulmonary/Chest: Effort normal and breath sounds normal. No respiratory distress. She has no wheezes. She has no rales.  Lungs are clear to auscultation bilaterally. No wheezing. No chest wall tenderness.  Abdominal: Soft. Bowel sounds are normal. She exhibits no distension. There is no tenderness. There is no guarding.   Musculoskeletal: She exhibits no edema or tenderness.  Lymphadenopathy:    She has no cervical adenopathy.  Neurological: She is alert and oriented to person, place, and time. Coordination normal.  Skin: Skin is warm and dry. No rash noted. She is not diaphoretic. No erythema. No pallor.  Psychiatric: She has a normal mood and affect. Her behavior is normal.  Nursing note and vitals reviewed.  ED Course  Procedures  DIAGNOSTIC STUDIES:  Oxygen Saturation is 98% on RA, normal by my interpretation.    COORDINATION OF CARE:  6:10 PM Will administer breathing treatment and order chest x-ray. Discussed treatment plan with parents at bedside and parents agreed to plan.  Labs Review Labs Reviewed  RAPID STREP SCREEN (NOT AT [facility])  CULTURE, GROUP A STREP [facility])    Imaging Review Dg Chest 2 View  04/02/2015  CLINICAL DATA:  Cough and fever and shortness of breath for 2 weeks EXAM: CHEST  2 VIEW COMPARISON:  None. FINDINGS: The heart size and mediastinal contours are within normal limits. Both lungs are clear. The visualized skeletal structures are unremarkable. IMPRESSION: No active cardiopulmonary disease. Electronically Signed   By: Loraine Leriche  Lukens M.D.   On: 04/02/2015 18:26   I have personally reviewed and evaluated these images and lab results as part of my medical decision-making.   EKG Interpretation None      Filed Vitals:   04/02/15 1730  BP: 140/78  Pulse: 89  Temp: 99.9 F (37.7 C)  Resp: 18     MDM   Meds given in ED:  Medications  acetaminophen (TYLENOL) tablet 650 mg (650 mg Oral Given 04/02/15 1823)    New Prescriptions   BENZONATATE (TESSALON) 100 MG CAPSULE    Take 1 capsule (100 mg total) by mouth 3 (three) times daily as needed for cough.   CETIRIZINE (ZYRTEC ALLERGY) 10 MG TABLET    Take 1 tablet (10 mg total) by mouth daily.   NAPROXEN (NAPROSYN) 500 MG TABLET    Take 1 tablet (500 mg total) by mouth 2 (two) times daily with a meal.    Final  diagnoses:  Influenza-like illness   This is a 30 y.o. female with no pertinent PMHx who presents to the Emergency Department complaining of persistent dry cough for the past two weeks. Pt reports associated generalized body aches, fatigue, sore throat, painful swallowing, wheezing, headache, sneezing, rhinorrhea, post nasal drip. On exam the patient is afebrile and nontoxic appearing. She has mild bilateral tonsillar hypertrophy without exudates. Lungs are clear to auscultation bilaterally. No wheezing. No rhonchi or crackles. Rapid strep is negative. Chest x-ray is unremarkable. Patient with influenza-like illness. Will discharge with prescriptions for naproxen, Zyrtec and Tessalon Perles. I encouraged her to push fluids and follow-up closely with her primary care provider. I discussed strict and specific return precautions. I advised the patient to follow-up with their primary care provider this week. I advised the patient to return to the emergency department with new or worsening symptoms or new concerns. The patient verbalized understanding and agreement with plan.    I personally performed the services described in this documentation, which was scribed in my presence. The recorded information has been reviewed and is accurate.       Everlene FarrierWilliam Earle Burson, PA-C 04/02/15 1848  Rolland PorterMark James, MD 04/12/15 [phone]

## 2015-04-05 LAB — CULTURE, GROUP A STREP (THRC)

## 2015-08-13 ENCOUNTER — Encounter: Payer: 59 | Admitting: Podiatry

## 2015-09-18 NOTE — Progress Notes (Signed)
This encounter was created in error - please disregard.

## 2016-07-09 IMAGING — MR MR SHOULDER*R* W/O CM
4 of 5 series · 14 of 40 positions shown · non-contrast
Comparison: None.

CLINICAL DATA: Right shoulder pain and weakness, worsening with
lifting. Steroid injection 3 months ago. No specific injury or prior
relevant surgery.

EXAM:
MRI OF THE RIGHT SHOULDER WITHOUT CONTRAST
TECHNIQUE: Multiplanar, multisequence MR imaging of the shoulder was performed.
No intravenous contrast was administered.

[Series 6: T2 fat-sat · axial · right · 3.0mm · 0.44mm/px · z∈[+6,+62]mm · 3 of 25 slices shown (1 of 3)]
[im 4/25]
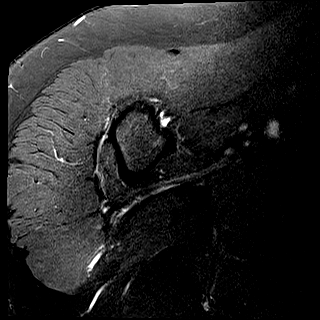
[im 14/25]
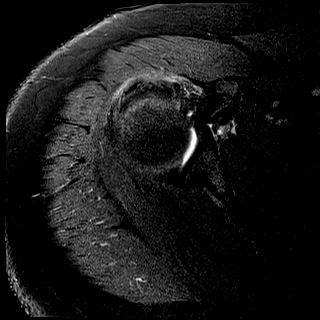
[im 21/25]
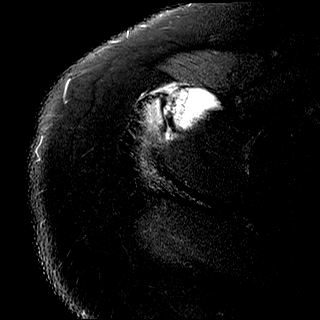

[Series 7: T2 fat-sat · oblique · right · 3.0mm · 0.44mm/px · 3 of 21 slices shown (2 of 3)]
[im 3/21]
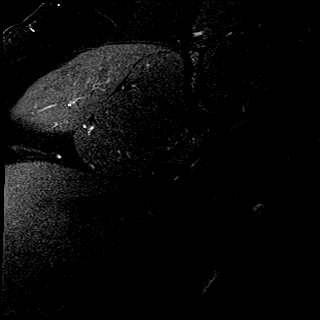
[im 12/21]
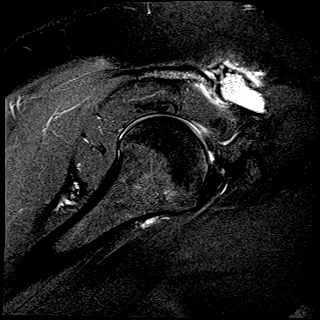
[im 18/21]
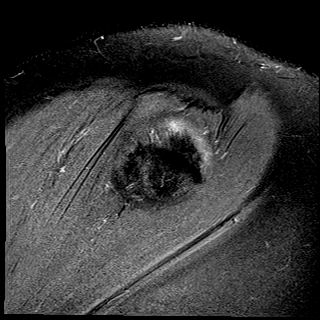

[Series 9: PD · oblique · right · 3.0mm · 0.18mm/px · 5 of 21 slices shown]
[im 1/21]
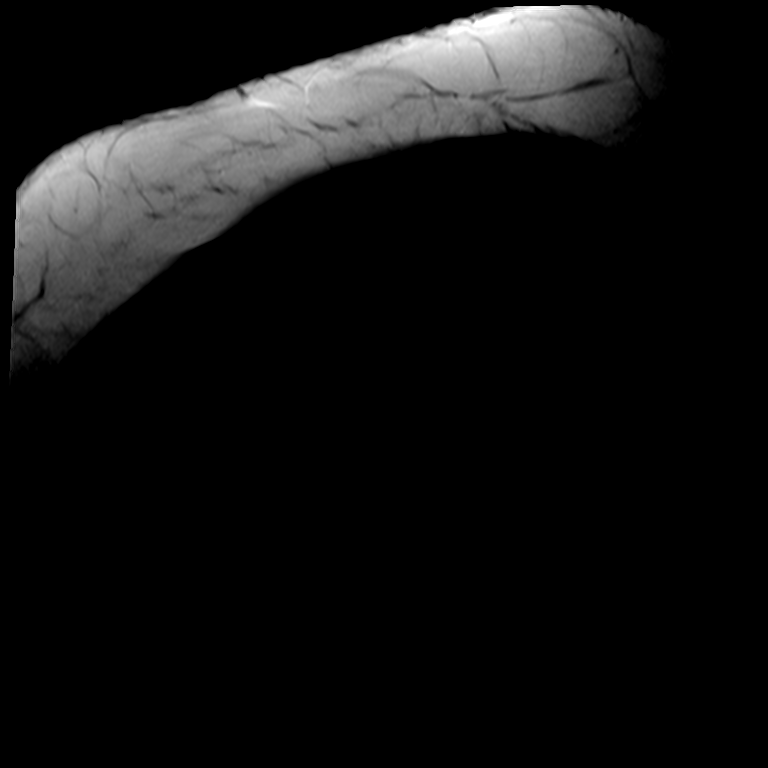
[im 3/21]
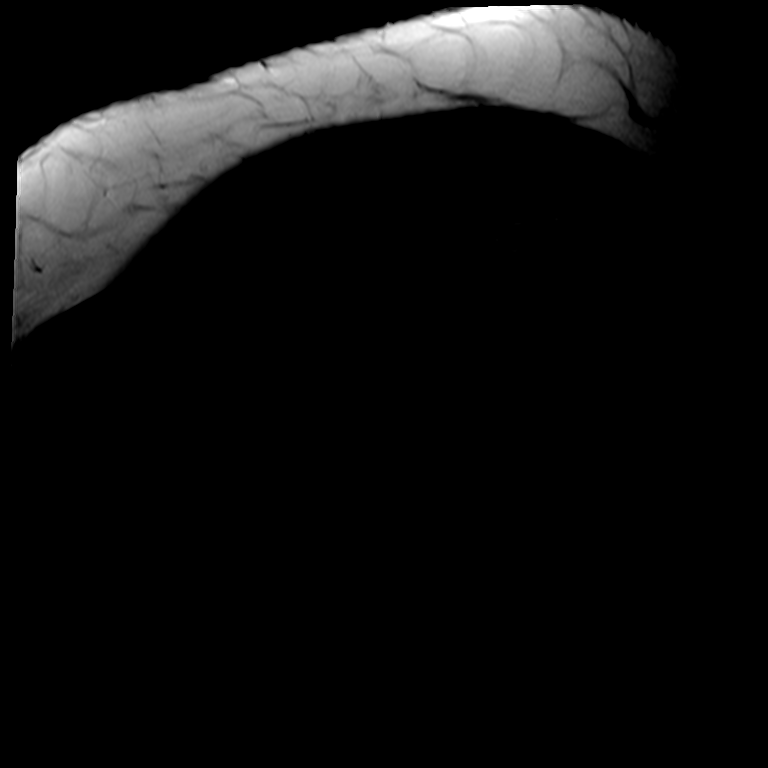
[im 6/21]
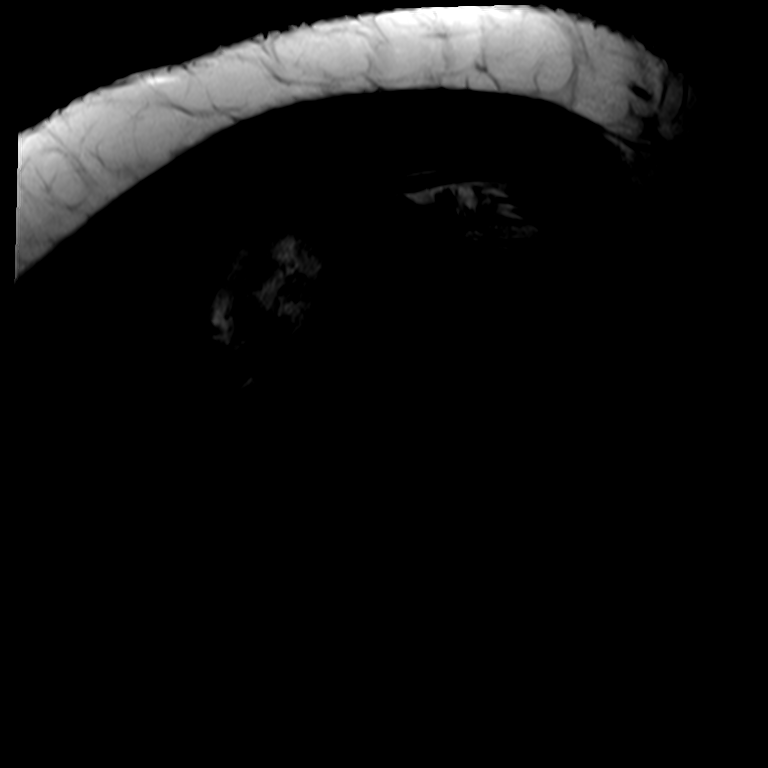
[im 12/21]
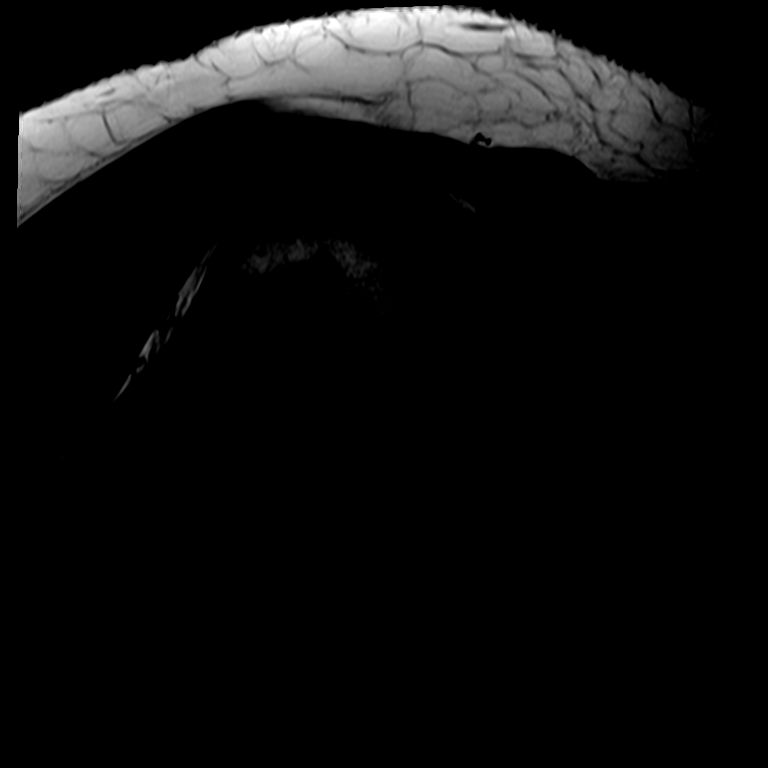
[im 18/21]
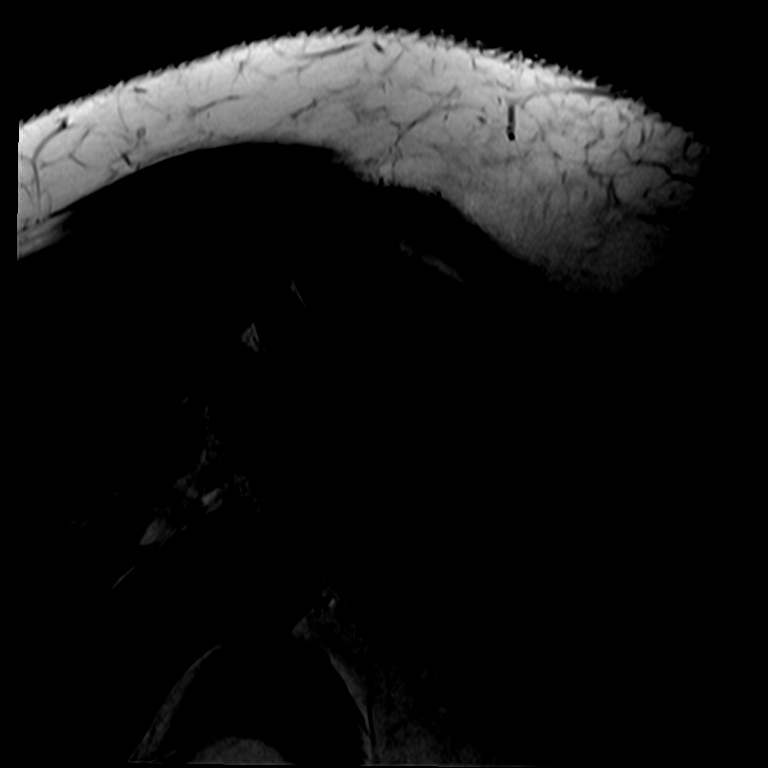

[Series 11: T2 fat-sat · oblique · right · 3.0mm · 0.22mm/px · 3 of 21 slices shown (3 of 3)]
[im 3/21]
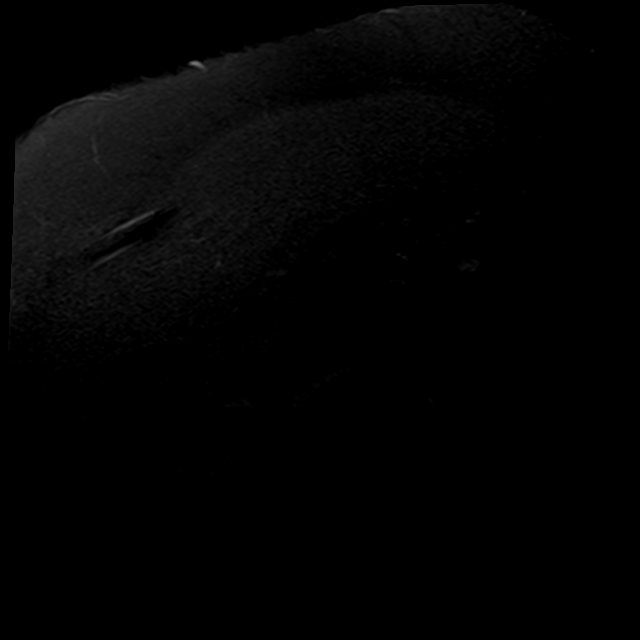
[im 12/21]
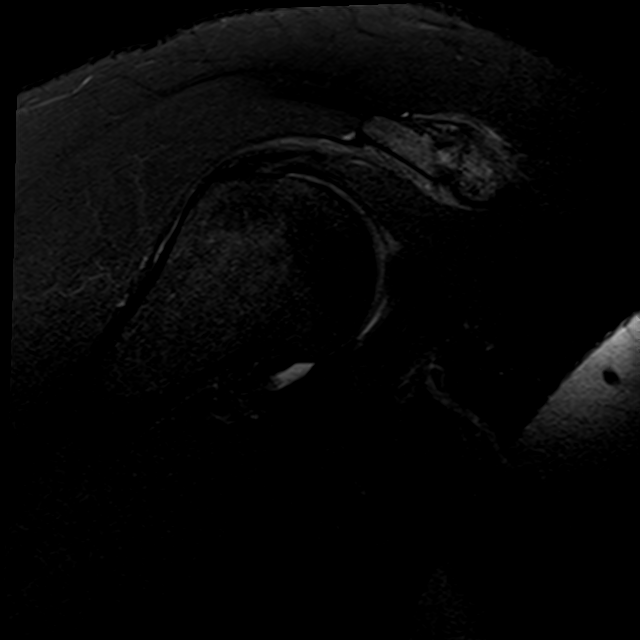
[im 18/21]
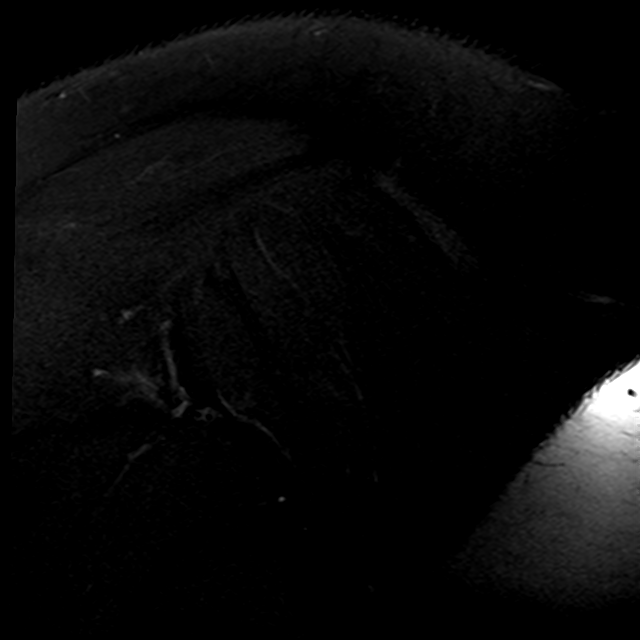

[14 of 40 positions shown; findings below may reference images not displayed]

FINDINGS: Rotator cuff: Mild infraspinatus and supraspinatus tendinosis. There
is no evidence of rotator cuff tear. The subscapularis and teres
minor tendons appear normal.

Muscles:  No focal muscular atrophy or edema.

Biceps long head:  Intact and normally positioned.

Acromioclavicular Joint: The acromion is type 2. There are severe
acromioclavicular degenerative changes with synovial thickening and
surrounding soft tissue edema. There is prominent edema in the
distal clavicle with probable osteolysis. There is mild edema within
the adjacent acromion. There is no widening of the AC joint. The
coracoclavicular ligament appears intact. There is minimal fluid in
the subacromial -subdeltoid bursa.

Glenohumeral Joint: No significant shoulder joint effusion or
glenohumeral arthropathy.

Labrum:  No evidence of labral tear.

Bones: No acute or significant extra-articular osseous findings.
IMPRESSION: 1. Severe acromioclavicular arthropathy with prominent bone marrow
edema and probable osteolysis of the distal clavicle.
2. No gross rotator cuff impingement. Mild supraspinatus and
infraspinatus tendinosis without evidence of rotator cuff tear.
3. The labrum and biceps tendon appear intact.

## 2016-08-10 IMAGING — CR DG CHEST 2V
2 series · 2 of 2 positions shown · non-contrast
Comparison: None.

CLINICAL DATA: Cough and fever and shortness of breath for 2 weeks

EXAM:
CHEST  2 VIEW

[w chest pa]
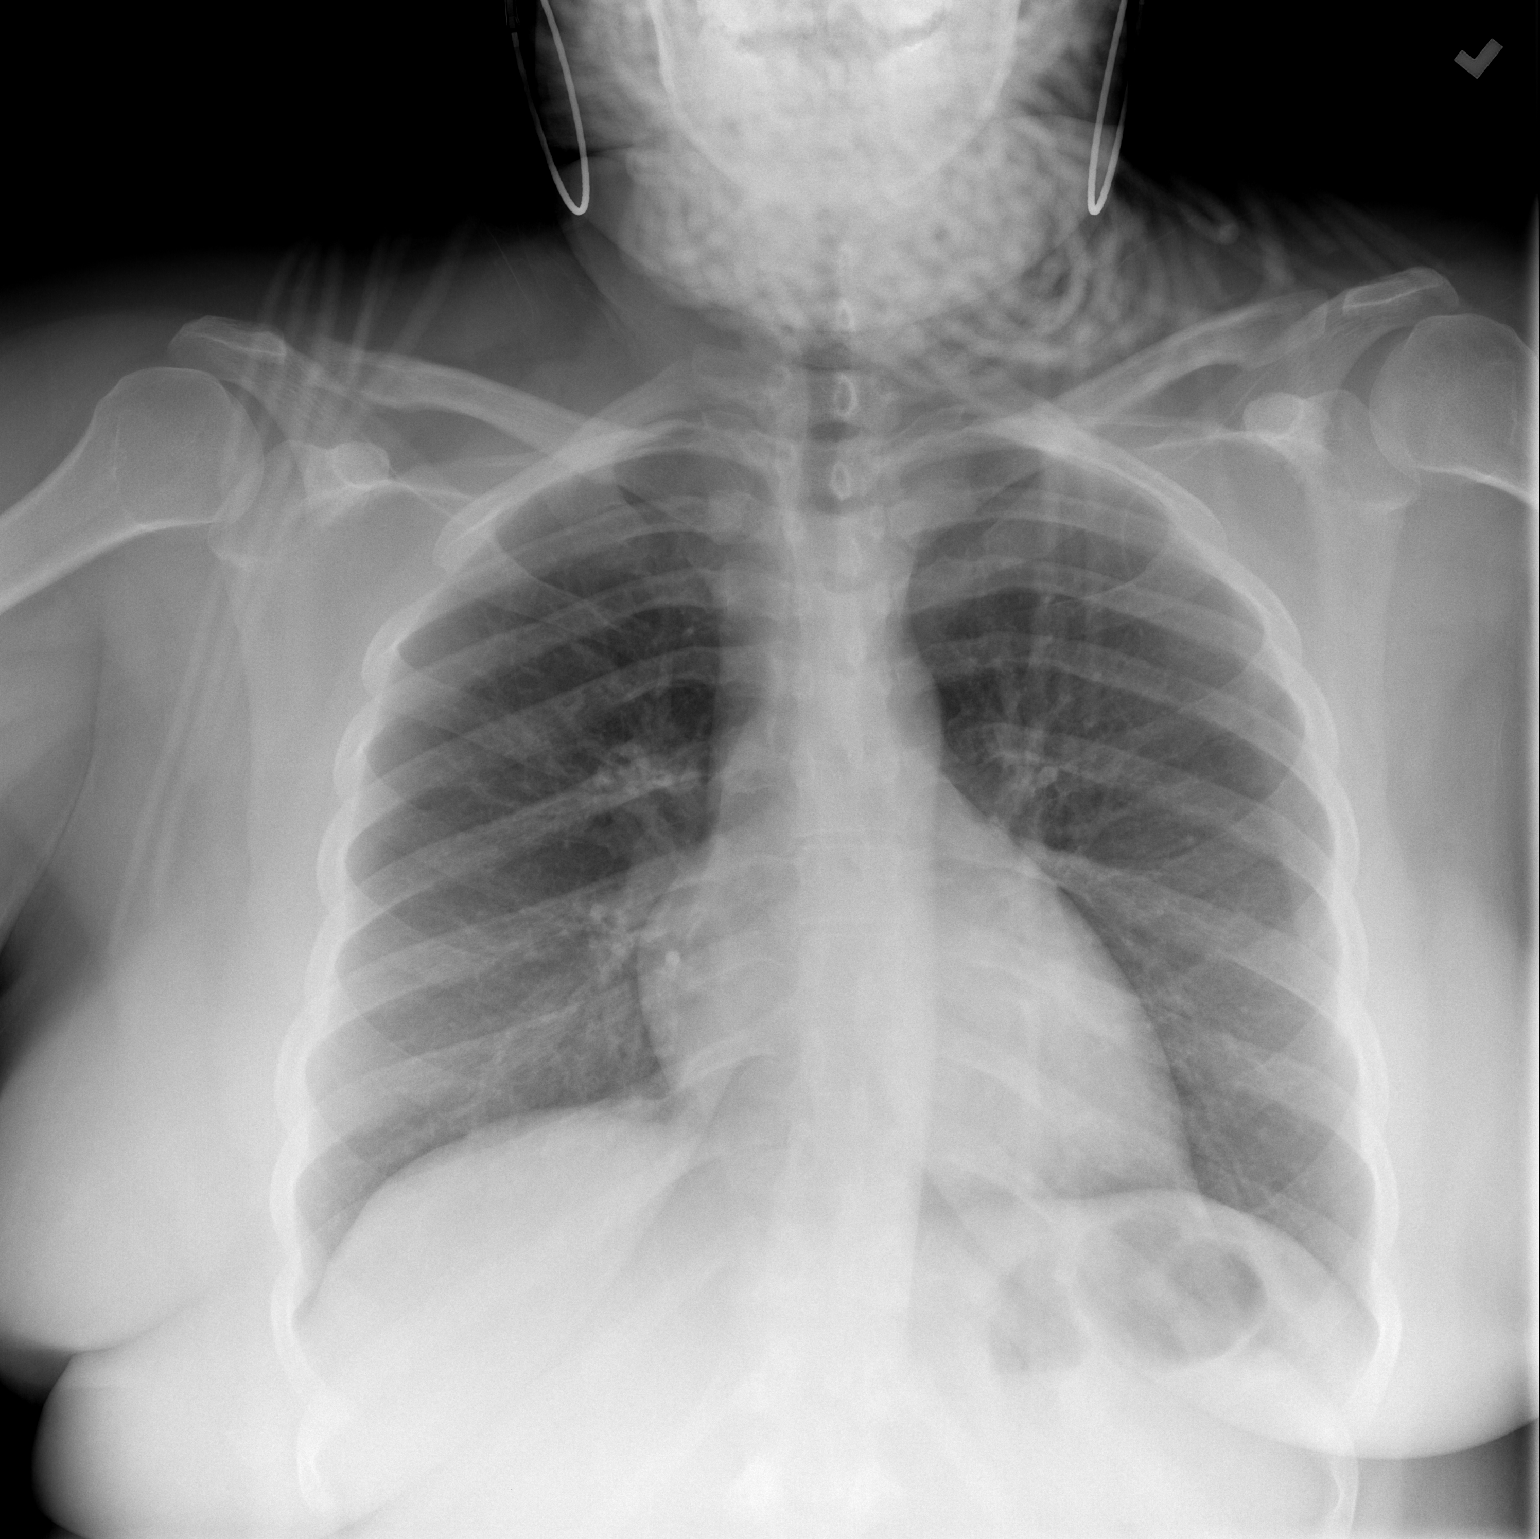

[w chest lat]
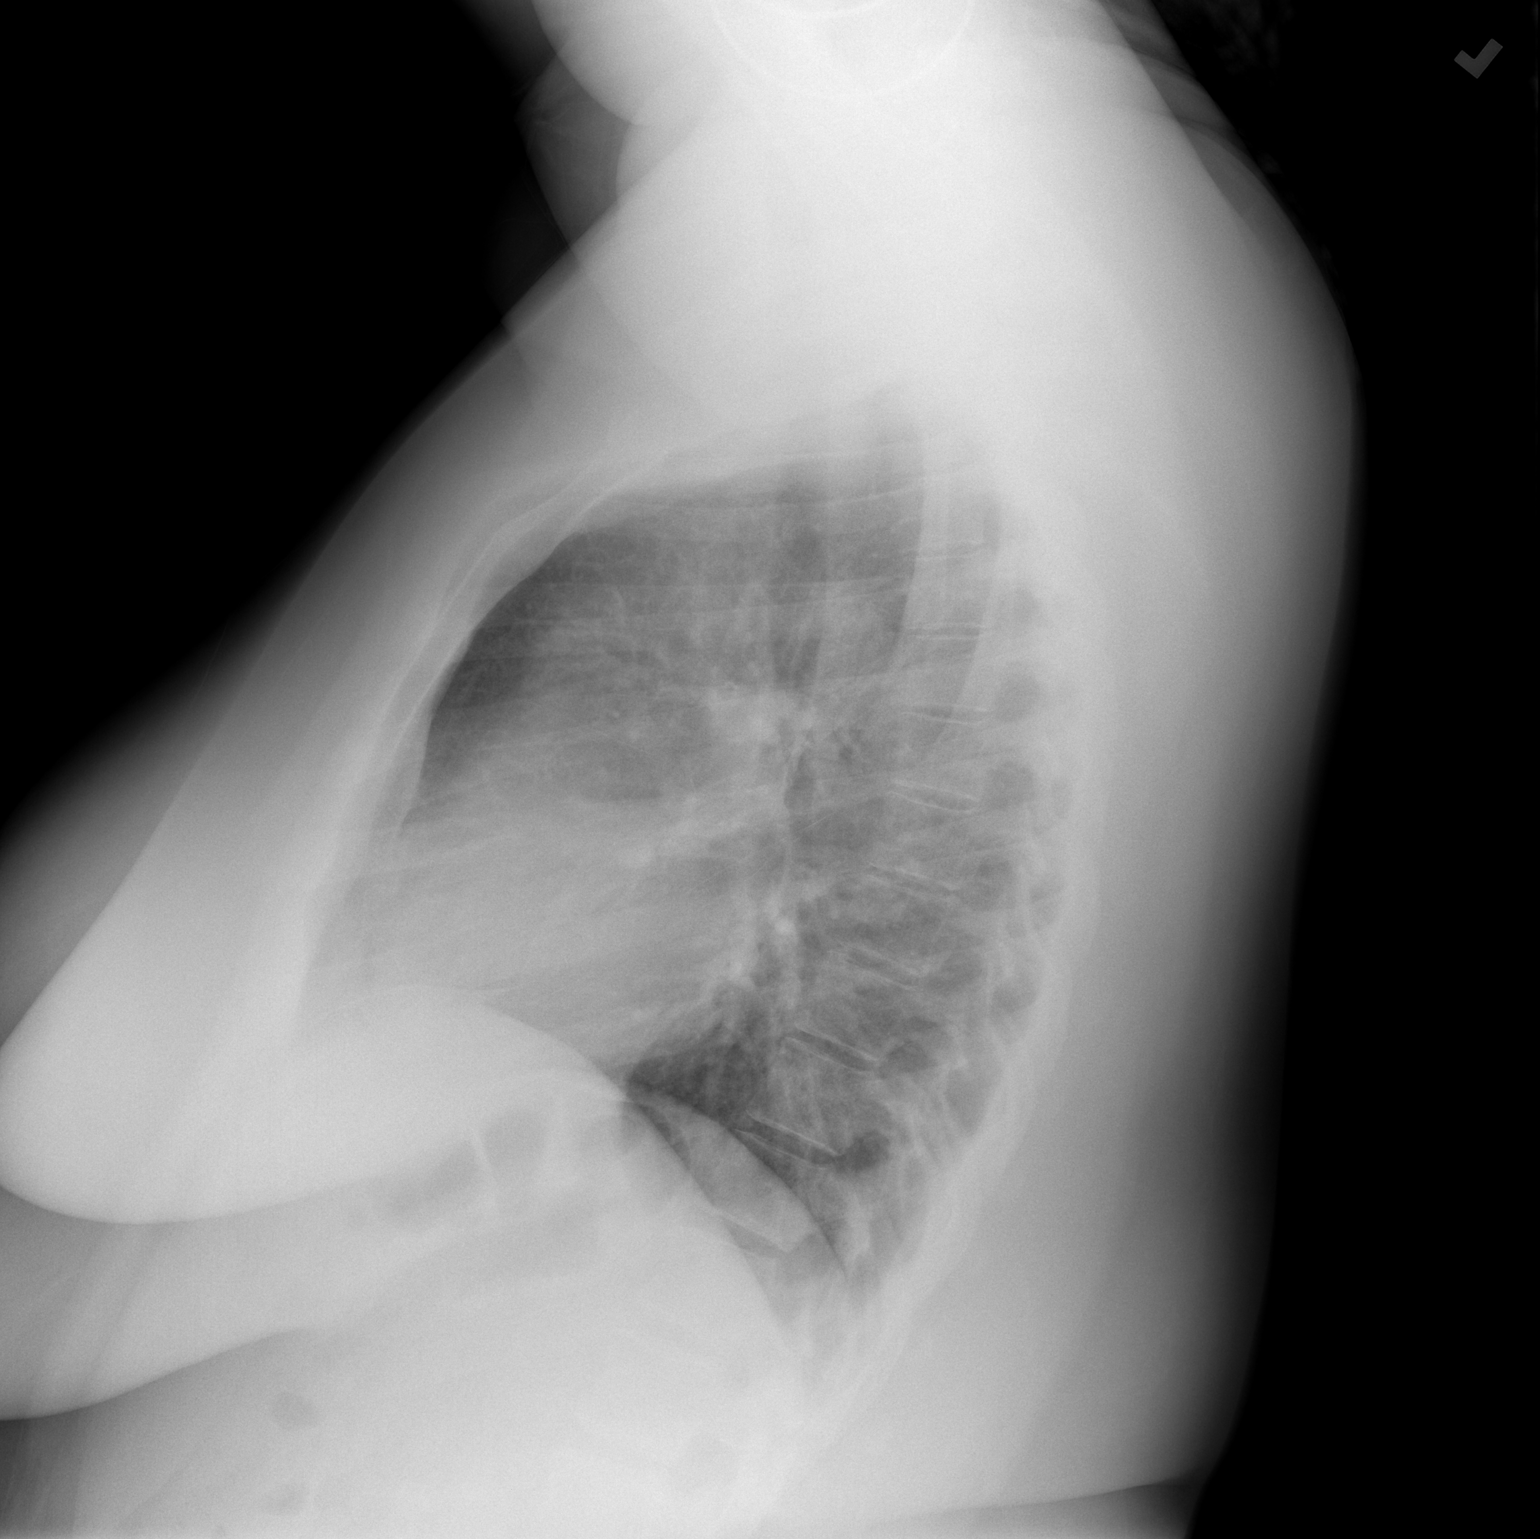

[2 of 2 positions shown; findings below may reference images not displayed]

FINDINGS: The heart size and mediastinal contours are within normal limits.
Both lungs are clear. The visualized skeletal structures are
unremarkable.
IMPRESSION: No active cardiopulmonary disease.

## 2016-09-19 ENCOUNTER — Ambulatory Visit (HOSPITAL_COMMUNITY)
Admission: EM | Admit: 2016-09-19 | Discharge: 2016-09-19 | Disposition: A | Discharge status: Home / Self Care | Payer: 59

## 2019-01-16 ENCOUNTER — Ambulatory Visit: Payer: 59 | Attending: Internal Medicine

## 2019-01-16 DIAGNOSIS — Z20822 Contact with and (suspected) exposure to covid-19: Secondary | ICD-10-CM

## 2019-01-17 LAB — NOVEL CORONAVIRUS, NAA: SARS-CoV-2, NAA: NOT DETECTED

## 2019-01-19 NOTE — L&D Delivery Note (Signed)
Delivery Note   Patient Name: Allison Sheppard DOB: May 18, 1985 MRN: [id number]  Date of admission: 12/03/2019 Delivering MD: Dale Martin  Date of delivery: 12/03/19 Type of delivery: SVD  Newborn Data: Live born female  Birth Weight:   APGAR: 8, 9  Newborn Delivery   Birth date/time: 12/03/2019 22:35:00 Delivery type: Vaginal, Spontaneous     [name], 34 y.o., @ [redacted]w[redacted]d,  G3P1011, who was admitted for IUP @ [redacted]w[redacted]d, presented for IOL for BMI of 56. 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 Pt labor progressed with IP foley, AROM, and pitocin without complications. I was called to the room when she progressed 2+ station in the second stage of labor.  She pushed for 5/min.  She delivered a viable infant, LOA and restituted to the LOA position over an intact perineum.  A tight nuchal cord   was identified, unable to reduce, therefore somersaulted through without complications, left compound hand was also noted. The baby was placed on maternal abdomen while initial step of NRP were perfmored (Dry, Stimulated, and warmed). Hat placed on baby for thermoregulation. Delayed cord clamping was performed for 2 minutes.  Cord double clamped and cut.  Cord cut by GM. Apgar scores were 8 and 9. Prophylactic Pitocin was started in the third stage of labor for active management. The placenta delivered spontaneously, shultz, with a 3 vessel cord and was sent to LD.  Inspection revealed none. An examination of the vaginal vault and cervix was free from lacerations. The uterus was firm, bleeding stable. Placenta and umbilical artery blood gas were not sent.  There were no complications during the procedure.  Mom and baby skin to skin following delivery. Left in stable condition.  Maternal Info: Anesthesia: Epidural Episiotomy: No Lacerations:  NO Suture Repair: NO Est. Blood Loss (mL):   Newborn Info:  Baby Sex:  female Circumcision: In pt desired Babies Name: Artist APGAR (1 MIN): 8   APGAR (5 MINS): 9   APGAR (10 MINS):     Mom to postpartum.  Baby to Couplet care / Skin to Skin.   [place], [place], NP-C 12/03/19 10:55 PM

## 2019-06-07 DIAGNOSIS — Z3490 Encounter for supervision of normal pregnancy, unspecified, unspecified trimester: Secondary | ICD-10-CM | POA: Insufficient documentation

## 2019-06-07 DIAGNOSIS — O99210 Obesity complicating pregnancy, unspecified trimester: Secondary | ICD-10-CM | POA: Insufficient documentation

## 2019-06-07 DIAGNOSIS — Z8759 Personal history of other complications of pregnancy, childbirth and the puerperium: Secondary | ICD-10-CM | POA: Insufficient documentation

## 2019-06-13 LAB — OB RESULTS CONSOLE HIV ANTIBODY (ROUTINE TESTING): HIV: NONREACTIVE

## 2019-06-13 LAB — OB RESULTS CONSOLE ANTIBODY SCREEN: Antibody Screen: NEGATIVE

## 2019-06-13 LAB — OB RESULTS CONSOLE ABO/RH: RH Type: POSITIVE

## 2019-06-13 LAB — OB RESULTS CONSOLE RUBELLA ANTIBODY, IGM: Rubella: IMMUNE

## 2019-06-13 LAB — OB RESULTS CONSOLE RPR: RPR: NONREACTIVE

## 2019-06-13 LAB — OB RESULTS CONSOLE HEPATITIS B SURFACE ANTIGEN: Hepatitis B Surface Ag: NEGATIVE

## 2019-06-26 LAB — OB RESULTS CONSOLE GC/CHLAMYDIA
Chlamydia: NEGATIVE
Gonorrhea: NEGATIVE

## 2019-09-15 ENCOUNTER — Inpatient Hospital Stay (HOSPITAL_COMMUNITY)
Admission: AD | Admit: 2019-09-15 | Discharge: 2019-09-15 | Disposition: A | Discharge status: Home / Self Care | Payer: 59 | Attending: Obstetrics & Gynecology | Admitting: Obstetrics & Gynecology

## 2019-09-15 ENCOUNTER — Other Ambulatory Visit: Payer: Self-pay

## 2019-09-15 ENCOUNTER — Encounter (HOSPITAL_COMMUNITY): Payer: Self-pay | Admitting: Obstetrics & Gynecology

## 2019-09-15 DIAGNOSIS — O99333 Smoking (tobacco) complicating pregnancy, third trimester: Secondary | ICD-10-CM | POA: Insufficient documentation

## 2019-09-15 DIAGNOSIS — Z791 Long term (current) use of non-steroidal anti-inflammatories (NSAID): Secondary | ICD-10-CM | POA: Diagnosis not present

## 2019-09-15 DIAGNOSIS — O99613 Diseases of the digestive system complicating pregnancy, third trimester: Secondary | ICD-10-CM

## 2019-09-15 DIAGNOSIS — Z79899 Other long term (current) drug therapy: Secondary | ICD-10-CM | POA: Insufficient documentation

## 2019-09-15 DIAGNOSIS — F17210 Nicotine dependence, cigarettes, uncomplicated: Secondary | ICD-10-CM | POA: Diagnosis not present

## 2019-09-15 DIAGNOSIS — K5900 Constipation, unspecified: Secondary | ICD-10-CM | POA: Diagnosis not present

## 2019-09-15 DIAGNOSIS — Z3A28 28 weeks gestation of pregnancy: Secondary | ICD-10-CM | POA: Diagnosis not present

## 2019-09-15 MED ORDER — DOCUSATE SODIUM 100 MG PO CAPS: 100.0000 mg | ORAL_CAPSULE | Freq: Two times a day (BID) | ORAL | 0 refills | Status: AC

## 2019-09-15 NOTE — Discharge Instructions (Signed)
Constipation/Bowel Prep ° °You have constipation which is hard stools that are difficult to pass. It is important to have regular bowel movements every 1-3 days that are soft and easy to pass. Hard stools increase your risk of hemorrhoids and are very uncomfortable.  ° °To prevent constipation you can increase the amount of fiber in your diet. Examples of foods with fiber are leafy greens, whole grain breads, oatmeal and other grains.  It is also important to drink at least eight 8oz glass of water everyday.  ° °If you have not has a bowel movement in 4-5 days you made need to clean out your bowel.  This will have establish normal movement through your bowel.   ° °Miralax Clean out °Take 8 capfuls of miralax in 64 oz of gatorade. You can use any fluid that appeals to you (gatorade, water, juice) °Continue to drink at least eight 8 oz glasses of water throughout the day °You can repeat with another 8 capfuls of miralax in 64 oz of gatorade if you are not having a large amount of stools °You will need to be at home and close to a bathroom for about 8 hours when you do the above as you may need to go to the bathroom frequently.  ° °After you are cleaned out: °- Start Colace100mg twice daily °- Start Miralax once daily °- Start a daily fiber supplement like metamucil or citrucel °- You can safely use enemas in pregnancy  °- if you are having diarrhea you can reduce to Colace once a day or miralax every other day or a 1/2 capful daily.  ° °Safe Medications in Pregnancy  ° °Acne: °Benzoyl Peroxide °Salicylic Acid ° °Backache/Headache: °Tylenol: 2 regular strength every 4 hours OR °             2 Extra strength every 6 hours ° °Colds/Coughs/Allergies: °Benadryl (alcohol free) 25 mg every 6 hours as needed °Breath right strips °Claritin °Cepacol throat lozenges °Chloraseptic throat spray °Cold-Eeze- up to three times per day °Cough drops, alcohol free °Flonase (by prescription only) °Guaifenesin °Mucinex °Robitussin DM  (plain only, alcohol free) °Saline nasal spray/drops °Sudafed (pseudoephedrine) & Actifed ** use only after [redacted] weeks gestation and if you do not have high blood pressure °Tylenol °Vicks Vaporub °Zinc lozenges °Zyrtec  ° °Constipation: °Colace °Ducolax suppositories °Fleet enema °Glycerin suppositories °Metamucil °Milk of magnesia °Miralax °Senokot °Smooth move tea ° °Diarrhea: °Kaopectate °Imodium A-D ° °*NO pepto Bismol ° °Hemorrhoids: °Anusol °Anusol HC °Preparation H °Tucks ° °Indigestion: °Tums °Maalox °Mylanta °Zantac  °Pepcid ° °Insomnia: °Benadryl (alcohol free) 25mg every 6 hours as needed °Tylenol PM °Unisom, no Gelcaps ° °Leg Cramps: °Tums °MagGel ° °Nausea/Vomiting:  °Bonine °Dramamine °Emetrol °Ginger extract °Sea bands °Meclizine  °Nausea medication to take during pregnancy:  °Unisom (doxylamine succinate 25 mg tablets) Take one tablet daily at bedtime. If symptoms are not adequately controlled, the dose can be increased to a maximum recommended dose of two tablets daily (1/2 tablet in the morning, 1/2 tablet mid-afternoon and one at bedtime). °Vitamin B6 100mg tablets. Take one tablet twice a day (up to 200 mg per day). ° °Skin Rashes: °Aveeno products °Benadryl cream or 25mg every 6 hours as needed °Calamine Lotion °1% cortisone cream ° °Yeast infection: °Gyne-lotrimin 7 °Monistat 7 ° ° °**If taking multiple medications, please check labels to avoid duplicating the same active ingredients °**take medication as directed on the label °** Do not exceed 4000 mg of tylenol in 24 hours °**Do not   take medications that contain aspirin or ibuprofen ° ° ° °

## 2019-09-15 NOTE — MAU Note (Signed)
Allison Sheppard is a 34 y.o. at [redacted]w[redacted]d here in MAU reporting: has not a good bowel movement in 2 weeks. States she was told to take magnesium OTC and took that today around 1230. States she feels like she needs to have a bowel movement but it hurts. Has been unable urinate since 1130/12 but states she feels like she needs to go. +FM  Onset of complaint: today  Pain score: 10/10  Vitals:   09/15/19 1517  BP: 134/79  Pulse: (!) 101  Resp: 18  Temp: 98.8 F (37.1 C)  SpO2: 99%     FHT: +FM  Lab orders placed from triage: UA

## 2019-09-15 NOTE — MAU Provider Note (Signed)
History     CSN: [id number]  Arrival date and time: 09/15/19 1408   First Provider Initiated Contact with Patient 09/15/19 1533      Chief Complaint  Patient presents with  . Constipation   HPI Allison Sheppard is a 34 y.o. G3P1011 at [redacted]w[redacted]d who presents with constipation. She states she hasn't been able to have a good bowel movement in 2 weeks and feels like she needs to go. She states she took magnesium citrate at noon with no results. She states she thought she also could not urinate but was able to go upon arrival to MAU. She denies any abdominal pain, vaginal bleeding or leaking of fluid.   OB History    Gravida  3   Para  1   Term  1   Preterm      AB  1   Living  1     SAB      TAB  1   Ectopic      Multiple      Live Births  1           Past Medical History:  Diagnosis Date  . Abnormal Pap smear   . Chlamydia   . Elective abortion     Past Surgical History:  Procedure Laterality Date  . NO PAST SURGERIES      Family History  Problem Relation Age of Onset  . Other Neg Hx     Social History   Tobacco Use  . Smoking status: Current Every Day Smoker    Packs/day: 0.50    Types: Cigarettes    Last attempt to quit: 10/05/2011    Years since quitting: 7.9  . Smokeless tobacco: Never Used  Substance Use Topics  . Alcohol use: No  . Drug use: No    Allergies: No Known Allergies  Medications Prior to Admission  Medication Sig Dispense Refill Last Dose  . azithromycin (ZITHROMAX Z-PAK) 250 MG tablet Take as directed on pack 6 tablet 0   . benzonatate (TESSALON) 100 MG capsule Take 1 capsule (100 mg total) by mouth 3 (three) times daily as needed for cough. 21 capsule 0   . cephALEXin (KEFLEX) 500 MG capsule Take 1 capsule (500 mg total) by mouth 2 (two) times daily. 14 capsule 0   . cetirizine (ZYRTEC ALLERGY) 10 MG tablet Take 1 tablet (10 mg total) by mouth daily. 30 tablet 1   . docusate sodium (COLACE) 100 MG capsule Take 100 mg  by mouth 2 (two) times daily.     Marland Kitchen etonogestrel-ethinyl estradiol (NUVARING) 0.12-0.015 MG/24HR vaginal ring Place 1 each vaginally every 28 (twenty-eight) days. Insert vaginally and leave in place for 3 consecutive weeks, then remove for 1 week.     Marland Kitchen ibuprofen (ADVIL,MOTRIN) 600 MG tablet Take 1 tablet (600 mg total) by mouth every 6 (six) hours. 30 tablet 0   . ipratropium (ATROVENT) 0.06 % nasal spray Place 2 sprays into both nostrils 4 (four) times daily. 15 mL 1   . Iron Polysacch Cmplx-B12-FA 150-0.025-1 MG CAPS Take 1 tablet by mouth 2 (two) times daily. 60 each 0   . naproxen (NAPROSYN) 500 MG tablet Take 1 tablet (500 mg total) by mouth 2 (two) times daily with a meal. 30 tablet 0   . norethindrone (ORTHO MICRONOR) 0.35 MG tablet Take 1 tablet (0.35 mg total) by mouth daily. 1 Package 11   . oxyCODONE-acetaminophen (PERCOCET/ROXICET) 5-325 MG per tablet Take 2 tablets by mouth  every 6 (six) hours as needed for severe pain. 30 tablet 0   . Prenatal Vit-Min-FA-Fish Oil ([employer] PRENATAL GUMMY PO) Take 2 tablets by mouth daily.       Review of Systems  Constitutional: Negative.  Negative for fatigue and fever.  HENT: Negative.   Respiratory: Negative.  Negative for shortness of breath.   Cardiovascular: Negative.  Negative for chest pain.  Gastrointestinal: Positive for constipation. Negative for abdominal pain, diarrhea, nausea and vomiting.  Genitourinary: Negative.  Negative for dysuria, vaginal bleeding and vaginal discharge.  Neurological: Negative.  Negative for dizziness and headaches.   Physical Exam   Blood pressure 134/79, pulse (!) 101, temperature 98.8 F (37.1 C), temperature source Oral, resp. rate 18, height 5\' 2"  (1.575 m), weight 131.5 kg, SpO2 99 %.  Physical Exam Vitals and nursing note reviewed.  Constitutional:      General: She is not in acute distress.    Appearance: She is well-developed.  HENT:     Head: Normocephalic.  Eyes:     Pupils: Pupils are equal,  round, and reactive to light.  Cardiovascular:     Rate and Rhythm: Normal rate and regular rhythm.     Heart sounds: Normal heart sounds.  Pulmonary:     Effort: Pulmonary effort is normal. No respiratory distress.     Breath sounds: Normal breath sounds.  Abdominal:     General: Bowel sounds are normal. There is no distension.     Palpations: Abdomen is soft.     Tenderness: There is no abdominal tenderness.  Skin:    General: Skin is warm and dry.  Neurological:     Mental Status: She is alert and oriented to person, place, and time.  Psychiatric:        Behavior: Behavior normal.        Thought Content: Thought content normal.        Judgment: Judgment normal.     Fetal Tracing:  Baseline: 140 Variability: moderate  Accels: 10x10 Decels: none  Toco: none   MAU Course  Procedures  MDM Soap suds enema- patient reports good relief.  Assessment and Plan   1. Constipation during pregnancy in third trimester   2. [redacted] weeks gestation of pregnancy    -Discharge home in stable condition -Rx for colace sent to patient's pharmacy -Constipation precautions discussed -Patient advised to follow-up with OB as scheduled for prenatal care -Patient may return to MAU as needed or if her condition were to change or worsen   CNM 09/15/2019, 3:33 PM

## 2019-11-07 LAB — OB RESULTS CONSOLE GBS: GBS: NEGATIVE

## 2019-11-27 ENCOUNTER — Telehealth (HOSPITAL_COMMUNITY): Payer: Self-pay | Admitting: *Deleted

## 2019-11-27 ENCOUNTER — Other Ambulatory Visit: Payer: Self-pay | Admitting: Obstetrics and Gynecology

## 2019-11-27 ENCOUNTER — Encounter (HOSPITAL_COMMUNITY): Payer: Self-pay | Admitting: *Deleted

## 2019-11-27 NOTE — Telephone Encounter (Signed)
Preadmission screen  

## 2019-12-01 ENCOUNTER — Other Ambulatory Visit (HOSPITAL_COMMUNITY)
Admission: RE | Admit: 2019-12-01 | Discharge: 2019-12-01 | Disposition: A | Discharge status: Home / Self Care | Payer: 59 | Source: Ambulatory Visit | Attending: Obstetrics and Gynecology | Admitting: Obstetrics and Gynecology

## 2019-12-01 DIAGNOSIS — Z20822 Contact with and (suspected) exposure to covid-19: Secondary | ICD-10-CM | POA: Insufficient documentation

## 2019-12-01 DIAGNOSIS — Z01812 Encounter for preprocedural laboratory examination: Secondary | ICD-10-CM | POA: Insufficient documentation

## 2019-12-01 LAB — SARS CORONAVIRUS 2 (TAT 6-24 HRS): SARS Coronavirus 2: NEGATIVE

## 2019-12-03 ENCOUNTER — Inpatient Hospital Stay (HOSPITAL_COMMUNITY): Payer: 59

## 2019-12-03 ENCOUNTER — Other Ambulatory Visit: Payer: Self-pay

## 2019-12-03 ENCOUNTER — Inpatient Hospital Stay (HOSPITAL_COMMUNITY): Payer: 59 | Admitting: Anesthesiology

## 2019-12-03 ENCOUNTER — Encounter (HOSPITAL_COMMUNITY): Payer: Self-pay | Admitting: Obstetrics and Gynecology

## 2019-12-03 ENCOUNTER — Inpatient Hospital Stay (HOSPITAL_COMMUNITY)
Admission: AD | Admit: 2019-12-03 | Discharge: 2019-12-05 | DRG: 806 | Disposition: A | Discharge status: Home / Self Care | Payer: 59 | Attending: Obstetrics & Gynecology | Admitting: Obstetrics & Gynecology

## 2019-12-03 DIAGNOSIS — Z20822 Contact with and (suspected) exposure to covid-19: Secondary | ICD-10-CM | POA: Diagnosis present

## 2019-12-03 DIAGNOSIS — O99214 Obesity complicating childbirth: Principal | ICD-10-CM | POA: Diagnosis present

## 2019-12-03 DIAGNOSIS — O99334 Smoking (tobacco) complicating childbirth: Secondary | ICD-10-CM | POA: Diagnosis present

## 2019-12-03 DIAGNOSIS — O9832 Other infections with a predominantly sexual mode of transmission complicating childbirth: Secondary | ICD-10-CM | POA: Diagnosis present

## 2019-12-03 DIAGNOSIS — F17210 Nicotine dependence, cigarettes, uncomplicated: Secondary | ICD-10-CM | POA: Diagnosis present

## 2019-12-03 DIAGNOSIS — Z3A40 40 weeks gestation of pregnancy: Secondary | ICD-10-CM | POA: Diagnosis not present

## 2019-12-03 DIAGNOSIS — A6000 Herpesviral infection of urogenital system, unspecified: Secondary | ICD-10-CM | POA: Diagnosis present

## 2019-12-03 DIAGNOSIS — Z3490 Encounter for supervision of normal pregnancy, unspecified, unspecified trimester: Secondary | ICD-10-CM | POA: Diagnosis present

## 2019-12-03 LAB — CBC
HCT: 36.6 % (ref 36.0–46.0)
Hemoglobin: 11.7 g/dL — ABNORMAL LOW (ref 12.0–15.0)
MCH: 27.2 pg (ref 26.0–34.0)
MCHC: 32 g/dL (ref 30.0–36.0)
MCV: 85.1 fL (ref 80.0–100.0)
Platelets: 332 10*3/uL (ref 150–400)
RBC: 4.3 MIL/uL (ref 3.87–5.11)
RDW: 14.9 % (ref 11.5–15.5)
WBC: 12.4 10*3/uL — ABNORMAL HIGH (ref 4.0–10.5)
nRBC: 0 % (ref 0.0–0.2)

## 2019-12-03 LAB — TYPE AND SCREEN
ABO/RH(D): O POS
Antibody Screen: NEGATIVE

## 2019-12-03 LAB — RPR: RPR Ser Ql: NONREACTIVE

## 2019-12-03 MED ORDER — OXYCODONE-ACETAMINOPHEN 5-325 MG PO TABS: 1.0000 | ORAL_TABLET | ORAL | Status: DC | PRN

## 2019-12-03 MED ORDER — EPHEDRINE 5 MG/ML INJ: 10.0000 mg | INTRAVENOUS | Status: DC | PRN

## 2019-12-03 MED ORDER — OXYTOCIN-SODIUM CHLORIDE 30-0.9 UT/500ML-% IV SOLN
2.5000 [IU]/h | INTRAVENOUS | Status: DC
  Administered 2019-12-03: 2.5 [IU]/h via INTRAVENOUS

## 2019-12-03 MED ORDER — OXYTOCIN-SODIUM CHLORIDE 30-0.9 UT/500ML-% IV SOLN
1.0000 m[IU]/min | INTRAVENOUS | Status: DC
  Administered 2019-12-03: 2 m[IU]/min via INTRAVENOUS
  Filled 2019-12-03: qty 500

## 2019-12-03 MED ORDER — ACETAMINOPHEN 325 MG PO TABS: 650.0000 mg | ORAL_TABLET | ORAL | Status: DC | PRN

## 2019-12-03 MED ORDER — TERBUTALINE SULFATE 1 MG/ML IJ SOLN: 0.2500 mg | Freq: Once | INTRAMUSCULAR | Status: DC | PRN

## 2019-12-03 MED ORDER — DIPHENHYDRAMINE HCL 50 MG/ML IJ SOLN: 12.5000 mg | INTRAMUSCULAR | Status: DC | PRN

## 2019-12-03 MED ORDER — FENTANYL CITRATE (PF) 100 MCG/2ML IJ SOLN: 50.0000 ug | INTRAMUSCULAR | Status: DC | PRN

## 2019-12-03 MED ORDER — PHENYLEPHRINE 40 MCG/ML (10ML) SYRINGE FOR IV PUSH (FOR BLOOD PRESSURE SUPPORT)
80.0000 ug | PREFILLED_SYRINGE | INTRAVENOUS | Status: DC | PRN
  Filled 2019-12-03: qty 10

## 2019-12-03 MED ORDER — LIDOCAINE HCL (PF) 1 % IJ SOLN: 30.0000 mL | INTRAMUSCULAR | Status: DC | PRN

## 2019-12-03 MED ORDER — FENTANYL-BUPIVACAINE-NACL 0.5-0.125-0.9 MG/250ML-% EP SOLN
12.0000 mL/h | EPIDURAL | Status: DC | PRN
  Filled 2019-12-03: qty 250

## 2019-12-03 MED ORDER — MISOPROSTOL 25 MCG QUARTER TABLET
25.0000 ug | ORAL_TABLET | ORAL | Status: DC | PRN
  Filled 2019-12-03: qty 1

## 2019-12-03 MED ORDER — SODIUM CHLORIDE (PF) 0.9 % IJ SOLN
INTRAMUSCULAR | Status: DC | PRN
  Administered 2019-12-03: 12 mL/h via EPIDURAL

## 2019-12-03 MED ORDER — LIDOCAINE HCL (PF) 1 % IJ SOLN
INTRAMUSCULAR | Status: DC | PRN
  Administered 2019-12-03: 5 mL via EPIDURAL

## 2019-12-03 MED ORDER — OXYCODONE-ACETAMINOPHEN 5-325 MG PO TABS: 2.0000 | ORAL_TABLET | ORAL | Status: DC | PRN

## 2019-12-03 MED ORDER — OXYTOCIN BOLUS FROM INFUSION
333.0000 mL | Freq: Once | INTRAVENOUS | Status: AC
  Administered 2019-12-03: 333 mL via INTRAVENOUS

## 2019-12-03 MED ORDER — ONDANSETRON HCL 4 MG/2ML IJ SOLN
4.0000 mg | Freq: Four times a day (QID) | INTRAMUSCULAR | Status: DC | PRN
  Administered 2019-12-03: 4 mg via INTRAVENOUS
  Filled 2019-12-03: qty 2

## 2019-12-03 MED ORDER — LACTATED RINGERS IV SOLN
500.0000 mL | Freq: Once | INTRAVENOUS | Status: AC
  Administered 2019-12-03: 500 mL via INTRAVENOUS

## 2019-12-03 MED ORDER — LACTATED RINGERS IV SOLN: 500.0000 mL | INTRAVENOUS | Status: DC | PRN

## 2019-12-03 MED ORDER — PHENYLEPHRINE 40 MCG/ML (10ML) SYRINGE FOR IV PUSH (FOR BLOOD PRESSURE SUPPORT): 80.0000 ug | PREFILLED_SYRINGE | INTRAVENOUS | Status: DC | PRN

## 2019-12-03 MED ORDER — SOD CITRATE-CITRIC ACID 500-334 MG/5ML PO SOLN: 30.0000 mL | ORAL | Status: DC | PRN

## 2019-12-03 MED ORDER — LACTATED RINGERS IV SOLN: INTRAVENOUS | Status: DC

## 2019-12-03 NOTE — H&P (Signed)
OB ADMISSION/ HISTORY & PHYSICAL:  Admission Date: 12/03/2019  7:22 AM  Admit Diagnosis:   Allison Sheppard is a 34 y.o. female G3P1011 [redacted]w[redacted]d presenting for IOL d/t BMI of 56. Endorses active FM, denies LOF and vaginal bleeding.   History of current pregnancy: G3P1011   Primary OB Provider: [facility] Patient entered care with  at 9 wks.   EDC 12/03/19 by US/LMP 03/01/19 and congruent w/ 9.3 wk U/S.   Anatomy scan:  21.2 wks, complete w/ anterior placenta.   Antenatal testing: for BMI of 56 started at 28 weeks Last evaluation: 38.3 wks  Significant prenatal events: Hx of HSV on valtrex at 36 wks, oligo - resolved Patient Active Problem List   Diagnosis Date Noted  . Encounter for induction of labor 12/03/2019  . NSVD (normal spontaneous vaginal delivery) 11/23/2012  . Third degree perineal laceration 11/23/2012  . Hematoma of vagina during delivery 11/23/2012  . Late prenatal care 06/26/2012  . PCOS (polycystic ovarian syndrome) 06/26/2012  . Potential abnormality of glucose tolerance 06/26/2012  . Postural dizziness 06/26/2012    Prenatal Labs: ABO, Rh: --/--/O POS (11/15 0845) Antibody: NEG (11/15 0845) Rubella: Immune (05/26 0000)  RPR: Nonreactive (05/26 0000)  HBsAg: Negative (05/26 0000)  HIV: Non-reactive (05/26 0000)  GTT: 72 GBS: Negative/-- (10/20 0000)  GC/CHL: negative Genetics: Panorama low risk female Tdap/influenza vaccines: Tdap - UTD/ influenza - denies.   OB History  Gravida Para Term Preterm AB Living  3 1 1   1 1   SAB TAB Ectopic Multiple Live Births    1     1    # Outcome Date GA Lbr Len/2nd Weight Sex Delivery Anes PTL Lv  3 Current           2 Term 11/22/12 [redacted]w[redacted]d 15:20 / 00:17 3286 g M Vag-Spont EPI  LIV  1 TAB             Medical / Surgical History: Past medical history:  Past Medical History:  Diagnosis Date  . Abnormal Pap smear   . Chlamydia   . Elective abortion     Past surgical history:  Past Surgical History:  Procedure  Laterality Date  . DILATION AND CURETTAGE OF UTERUS    . NO PAST SURGERIES     Family History:  Family History  Problem Relation Age of Onset  . Other Neg Hx     Social History:  reports that she has been smoking cigarettes. She has been smoking about 0.50 packs per day. She has never used smokeless tobacco. She reports that she does not drink alcohol and does not use drugs.  Allergies: Patient has no known allergies.   Current Medications at time of admission:  Prior to Admission medications   Medication Sig Start Date End Date Taking? Authorizing Provider  cetirizine (ZYRTEC ALLERGY) 10 MG tablet Take 1 tablet (10 mg total) by mouth daily. 04/02/15   04/04/15, PA-C  ipratropium (ATROVENT) 0.06 % nasal spray Place 2 sprays into both nostrils 4 (four) times daily. 01/17/15   01/19/15, MD  Iron Polysacch Cmplx-B12-FA 150-0.025-1 MG CAPS Take 1 tablet by mouth 2 (two) times daily. 11/28/12   13/11/14, CNM  Prenatal Vit-Min-FA-Fish Oil ([employer] PRENATAL GUMMY PO) Take 2 tablets by mouth daily.    [provider]    Review of Systems: Constitutional: Negative   HENT: Negative   Eyes: Negative   Respiratory: Negative   Cardiovascular: Negative   Gastrointestinal: Negative  Genitourinary: negative for bloody show, negative for LOF   Musculoskeletal: Negative   Skin: Negative   Neurological: Negative   Endo/Heme/Allergies: Negative   Psychiatric/Behavioral: Negative    Physical Exam: VS: Blood pressure 127/75, pulse 91, temperature 98.3 F (36.8 C), temperature source Oral, resp. rate 20, height 5\' 2"  (1.575 m), weight (!) 141.1 kg. AAO x3, no signs of distress Cardiovascular: RRR Respiratory: Lung fields clear to ausculation GU/GI: Abdomen gravid, non-tender, non-distended, active FM, vertex, EFW 8 per Leopold's Extremities: negative edema, negative for pain, tenderness, and cords  Cervical exam:Dilation: 3 Effacement (%): 70, 80 Station: -2 Exam  by:: K Holdhouser CNM FHR: baseline rate 130 / variability moderate / accelerations  / negative decelerations TOCO: 1-4   Prenatal Transfer Tool  Maternal Diabetes: No Genetic Screening: Normal Maternal Ultrasounds/Referrals: Normal Fetal Ultrasounds or other Referrals:  None Maternal Substance Abuse:  No Significant Maternal Medications:  None Significant Maternal Lab Results: Group B Strep negative    Assessment: 34 y.o. G3P1011 [redacted]w[redacted]d  IOL with cooks catheter, pitocin Sterile spec exam - no lesions noted Pelvis proven to 7#3oz FHR category 1 GBS negative Pain management plan: Wants to go all natural. Fentanyl/ Epidural PRN   Plan:  Admit to L&D  Routine admission orders Fentanyl/ Epidural PRN  Dr [redacted]w[redacted]d notified of admission and plan of care  [name] MSN, CNM 12/03/2019 12:03 PM

## 2019-12-03 NOTE — Progress Notes (Signed)
Labor Progress Note  Allison Sheppard,  34 y.o., [id number], with an IUP @ [redacted]w[redacted]d, presented for IOL for BMI of 56. 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  Subjective:  Pt sleeping upon arrival to room. Epidural in place. Pt states she is comfortable. POC discussed with pt. SVE 4 cm. Cooks catheter removed. Discussed R/B/A  AROM with pt. Pt and FOB requesting AROM. AROM done, clear fluid noted. Encouraged position changes and peanut ball. Pitocin @ 66mu.   Patient Active Problem List   Diagnosis Date Noted  . Encounter for induction of labor 12/03/2019  . NSVD (normal spontaneous vaginal delivery) 11/23/2012  . Third degree perineal laceration 11/23/2012  . Hematoma of vagina during delivery 11/23/2012  . Late prenatal care 06/26/2012  . PCOS (polycystic ovarian syndrome) 06/26/2012  . Potential abnormality of glucose tolerance 06/26/2012  . Postural dizziness 06/26/2012   Objective: BP 102/64   Pulse (!) 105   Temp 98 F (36.7 C) (Oral)   Resp 16   Ht 5\' 2"  (1.575 m)   Wt (!) 141.1 kg   SpO2 100%   BMI 56.88 kg/m  I/O last 3 completed shifts: In: -  Out: 700 [Urine:700] No intake/output data recorded. NST: FHR baseline 135 bpm, Variability: moderate, Accelerations:present, Decelerations:  Absent= Cat 1/Reactive CTX:  regular, every 2-3 minutes, lasting 70-110 seconds Uterus gravid, soft non tender, moderate to palpate with contractions.  SVE:  Dilation: 5 Effacement (%): 80 Station: -2 Exam by:: J Humaira Sculley CNM  Pitocin at 19mUn/min IUPC placed, pt tolerated well.   Assessment:  4m,  34 y.o., G3P1011, with an IUP @ [redacted]w[redacted]d, presented for IOL for BMI of 56. HSV, on valtrex, no lesion or prodromal s/sx, Pelvis proven to 7.3lbs, [redacted]w[redacted]d EFW was 7.11 on 11/4. Pt currently progressing latent labor and comfortable with epidural, IUPC placed.  Patient Active Problem List   Diagnosis Date Noted  . Encounter for  induction of labor 12/03/2019  . NSVD (normal spontaneous vaginal delivery) 11/23/2012  . Third degree perineal laceration 11/23/2012  . Hematoma of vagina during delivery 11/23/2012  . Late prenatal care 06/26/2012  . PCOS (polycystic ovarian syndrome) 06/26/2012  . Potential abnormality of glucose tolerance 06/26/2012  . Postural dizziness 06/26/2012    NICHD: Category 1  Membranes:  AROM 1413 on 11/15, clear fluid. no s/s of infection  IUPC: MVU 220s  Induction:    Cytotec: N/A  Foley Bulb: placed at 0900 out at 1400  Pitocin - 11mu  Pain management:                Epidural placement: at 1145  on 12/03/19  GBS negative  Plan: Continue labor plan Continuous monitoring Frequent position changes to facilitate fetal rotation and descent. Will reassess with cervical exam at 4 or earlier if necessary Continue pitocin per protocol 2x2 titrate to MVU 200-250s Anticipate labor progression and vaginal delivery  [facility], FNP-C, PMHNP-BC 12/03/2019. 7:35 PM

## 2019-12-03 NOTE — Progress Notes (Signed)
Sterile Speculum negative for herpes leisons

## 2019-12-03 NOTE — Progress Notes (Signed)
F&m monitor not tracing well.  F&m monitor removed and external monitors applied

## 2019-12-03 NOTE — Progress Notes (Signed)
Pt had large emesis 

## 2019-12-03 NOTE — Progress Notes (Signed)
Previous f & m monitor not tracing.  Removed and new monitor applied

## 2019-12-03 NOTE — Anesthesia Preprocedure Evaluation (Addendum)
Anesthesia Evaluation  Patient identified by MRN, date of birth, ID band Patient awake    Reviewed: Allergy & Precautions, NPO status , Patient's Chart, lab work & pertinent test results  Airway Mallampati: III  TM Distance: >3 FB Neck ROM: Full    Dental no notable dental hx. (+) Teeth Intact, Dental Advisory Given   Pulmonary Current Smoker,    Pulmonary exam normal breath sounds clear to auscultation       Cardiovascular Exercise Tolerance: Good negative cardio ROS Normal cardiovascular exam Rhythm:Regular Rate:Normal     Neuro/Psych  Neuromuscular disease negative neurological ROS     GI/Hepatic negative GI ROS, Neg liver ROS,   Endo/Other    Renal/GU negative Renal ROS     Musculoskeletal   Abdominal (+) + obese,   Peds  Hematology Hgb 11.7 Plt 332   Anesthesia Other Findings   Reproductive/Obstetrics (+) Pregnancy                            Anesthesia Physical Anesthesia Plan  ASA: III  Anesthesia Plan:    Post-op Pain Management:    Induction:   PONV Risk Score and Plan:   Airway Management Planned:   Additional Equipment:   Intra-op Plan:   Post-operative Plan:   Informed Consent: I have reviewed the patients History and Physical, chart, labs and discussed the procedure including the risks, benefits and alternatives for the proposed anesthesia with the patient or authorized representative who has indicated his/her understanding and acceptance.       Plan Discussed with: CRNA  Anesthesia Plan Comments: (40wk G3P1 for LEA)        Anesthesia Quick Evaluation

## 2019-12-03 NOTE — Anesthesia Procedure Notes (Signed)
Epidural Patient location during procedure: OB Start time: 12/03/2019 12:31 PM End time: 12/03/2019 12:46 PM  Staffing Anesthesiologist: Trevor Iha, MD Performed: anesthesiologist   Preanesthetic Checklist Completed: patient identified, IV checked, site marked, risks and benefits discussed, surgical consent, monitors and equipment checked, pre-op evaluation and timeout performed  Epidural Patient position: sitting Prep: DuraPrep and site prepped and draped Patient monitoring: continuous pulse ox and blood pressure Approach: midline Location: L3-L4 Injection technique: LOR air  Needle:  Needle type: Tuohy  Needle gauge: 17 G Needle length: 9 cm and 9 Needle insertion depth: 9 cm Catheter type: closed end flexible Catheter size: 19 Gauge Catheter at skin depth: 10 and 16 cm Test dose: negative  Assessment Events: blood not aspirated, injection not painful, no injection resistance, no paresthesia and negative IV test  Additional Notes Patient identified. Risks/Benefits/Options discussed with patient including but not limited to bleeding, infection, nerve damage, paralysis, failed block, incomplete pain control, headache, blood pressure changes, nausea, vomiting, reactions to medication both or allergic, itching and postpartum back pain. Confirmed with bedside nurse the patient's most recent platelet count. Confirmed with patient that they are not currently taking any anticoagulation, have any bleeding history or any family history of bleeding disorders. Patient expressed understanding and wished to proceed. All questions were answered. Sterile technique was used throughout the entire procedure. Please see nursing notes for vital signs. Test dose was given through epidural needle and negative prior to continuing to dose epidural or start infusion. Warning signs of high block given to the patient including shortness of breath, tingling/numbness in hands, complete motor block, or  any concerning symptoms with instructions to call for help. Patient was given instructions on fall risk and not to get out of bed. All questions and concerns addressed with instructions to call with any issues. 1 Attempt (S) . Patient tolerated procedure well.

## 2019-12-03 NOTE — Progress Notes (Signed)
Contraction assessment  Done on external tracing

## 2019-12-03 NOTE — Progress Notes (Addendum)
Labor Progress Note Allison Sheppard,  34 y.o., [id number], with an IUP @ [redacted]w[redacted]d, presented for IOL for BMI of 56.  Subjective:  Pt sleeping upon arrival to room. Epidural in place. Pt states she is comfortable. POC discussed with pt. SVE 4 cm. Cooks catheter removed. Discussed R/B/A  AROM with pt. Pt and FOB requesting AROM. AROM done, clear fluid noted. Encouraged position changes and peanut ball. Pitocin @ 29mu.   Patient Active Problem List   Diagnosis Date Noted  . Encounter for induction of labor 12/03/2019  . NSVD (normal spontaneous vaginal delivery) 11/23/2012  . Third degree perineal laceration 11/23/2012  . Hematoma of vagina during delivery 11/23/2012  . Late prenatal care 06/26/2012  . PCOS (polycystic ovarian syndrome) 06/26/2012  . Potential abnormality of glucose tolerance 06/26/2012  . Postural dizziness 06/26/2012  .      Objective: BP (!) 97/54   Pulse 87   Temp 98.1 F (36.7 C) (Oral)   Resp 16   Ht 5\' 2"  (1.575 m)   Wt (!) 141.1 kg   SpO2 100%   BMI 56.88 kg/m  No intake/output data recorded. Total I/O In: -  Out: 200 [Urine:200] NST: FHR baseline 130 bpm, Variability: moderate, Accelerations:present, Decelerations:  Absent= Cat 1/Reactive CTX:  regular, every 2-4 minutes Uterus gravid, soft non tender, moderate to palpate with contractions.  SVE:  Dilation: 4 Effacement (%): 80 Station: -2 Exam by:: K Holshauser   Pitocin at 64mUn/min  Assessment:  9m, 34 y.o., G3P1011, with an IUP @ [redacted]w[redacted]d, presented for IOL for elevated BMI.  Patient Active Problem List   Diagnosis Date Noted  . Encounter for induction of labor 12/03/2019  . NSVD (normal spontaneous vaginal delivery) 11/23/2012  . Third degree perineal laceration 11/23/2012  . Hematoma of vagina during delivery 11/23/2012  . Late prenatal care 06/26/2012  . PCOS (polycystic ovarian syndrome) 06/26/2012  . Potential abnormality of glucose tolerance 06/26/2012  . Postural  dizziness 06/26/2012  .    IOL d/t elevated BMI = 56  NICHD: Category 1 Membranes:  AROM clear fluid. no s/s of infection  Induction:     Foley Bulb: d/c  Pitocin - 19mu  Pain management:                Epidural placement: at 1145  on 12/03/19  GBS negative  Plan: Continue labor plan Continuous monitoring Rest Frequent position changes to facilitate fetal rotation and descent. Will reassess with cervical exam at 4 or earlier if necessary  Continue pitocin per protocol Anticipate labor progression and vaginal delivery  Dr 12/05/19 aware of plan and verbalized agreement.   [name], CNM, MSN 12/03/2019. 3:07 PM

## 2019-12-04 ENCOUNTER — Encounter (HOSPITAL_COMMUNITY): Payer: Self-pay | Admitting: Obstetrics and Gynecology

## 2019-12-04 LAB — CBC
HCT: 36.7 % (ref 36.0–46.0)
Hemoglobin: 11.8 g/dL — ABNORMAL LOW (ref 12.0–15.0)
MCH: 27.3 pg (ref 26.0–34.0)
MCHC: 32.2 g/dL (ref 30.0–36.0)
MCV: 84.8 fL (ref 80.0–100.0)
Platelets: 329 10*3/uL (ref 150–400)
RBC: 4.33 MIL/uL (ref 3.87–5.11)
RDW: 15 % (ref 11.5–15.5)
WBC: 21 10*3/uL — ABNORMAL HIGH (ref 4.0–10.5)
nRBC: 0 % (ref 0.0–0.2)

## 2019-12-04 MED ORDER — ZOLPIDEM TARTRATE 5 MG PO TABS: 5.0000 mg | ORAL_TABLET | Freq: Every evening | ORAL | Status: DC | PRN

## 2019-12-04 MED ORDER — PRENATAL MULTIVITAMIN CH
1.0000 | ORAL_TABLET | Freq: Every day | ORAL | Status: DC
  Administered 2019-12-04 – 2019-12-05 (×2): 1 via ORAL
  Filled 2019-12-04 (×2): qty 1

## 2019-12-04 MED ORDER — SIMETHICONE 80 MG PO CHEW: 80.0000 mg | CHEWABLE_TABLET | ORAL | Status: DC | PRN

## 2019-12-04 MED ORDER — COCONUT OIL OIL: 1.0000 "application " | TOPICAL_OIL | Status: DC | PRN

## 2019-12-04 MED ORDER — DIPHENHYDRAMINE HCL 25 MG PO CAPS: 25.0000 mg | ORAL_CAPSULE | Freq: Four times a day (QID) | ORAL | Status: DC | PRN

## 2019-12-04 MED ORDER — IBUPROFEN 600 MG PO TABS
600.0000 mg | ORAL_TABLET | Freq: Four times a day (QID) | ORAL | Status: DC
  Administered 2019-12-04 – 2019-12-05 (×5): 600 mg via ORAL
  Filled 2019-12-04 (×6): qty 1

## 2019-12-04 MED ORDER — TETANUS-DIPHTH-ACELL PERTUSSIS 5-2.5-18.5 LF-MCG/0.5 IM SUSY: 0.5000 mL | PREFILLED_SYRINGE | Freq: Once | INTRAMUSCULAR | Status: DC

## 2019-12-04 MED ORDER — ACETAMINOPHEN 325 MG PO TABS
650.0000 mg | ORAL_TABLET | ORAL | Status: DC | PRN
  Administered 2019-12-04 (×2): 650 mg via ORAL
  Filled 2019-12-04 (×2): qty 2

## 2019-12-04 MED ORDER — SENNOSIDES-DOCUSATE SODIUM 8.6-50 MG PO TABS
2.0000 | ORAL_TABLET | ORAL | Status: DC
  Administered 2019-12-04: 2 via ORAL
  Filled 2019-12-04: qty 2

## 2019-12-04 MED ORDER — BENZOCAINE-MENTHOL 20-0.5 % EX AERO: 1.0000 "application " | INHALATION_SPRAY | CUTANEOUS | Status: DC | PRN

## 2019-12-04 MED ORDER — ONDANSETRON HCL 4 MG PO TABS: 4.0000 mg | ORAL_TABLET | ORAL | Status: DC | PRN

## 2019-12-04 MED ORDER — DIBUCAINE (PERIANAL) 1 % EX OINT: 1.0000 "application " | TOPICAL_OINTMENT | CUTANEOUS | Status: DC | PRN

## 2019-12-04 MED ORDER — WITCH HAZEL-GLYCERIN EX PADS: 1.0000 "application " | MEDICATED_PAD | CUTANEOUS | Status: DC | PRN

## 2019-12-04 MED ORDER — ONDANSETRON HCL 4 MG/2ML IJ SOLN: 4.0000 mg | INTRAMUSCULAR | Status: DC | PRN

## 2019-12-04 NOTE — Progress Notes (Signed)
PPD # 1 S/P NSVD  Live born female  Birth Weight: 7 lb 14.8 oz (3595 g) APGAR: 8, 9  Newborn Delivery   Birth date/time: 12/03/2019 22:35:00 Delivery type: Vaginal, Spontaneous     Baby name: Artis Delivering provider: [place]   Episiotomy:None   Lacerations:None   Circumcision Yes, planning  Feeding: breast  Pain control at delivery: Epidural   Subjective   Reports feeling well.              Tolerating po/ No nausea or vomiting             Bleeding is moderate             Pain controlled with acetaminophen and ibuprofen (OTC)             Up ad lib / ambulatory / voiding without difficulties   Objective   A & O x 3, in no apparent distress              VS:  Vitals:   12/04/19 0100 12/04/19 0220 12/04/19 0644 12/04/19 1111  BP: (!) 106/57 (!) 124/56 123/67 95/71  Pulse: 96 96 78 85  Resp: 18 16 18 17   Temp:  97.9 F (36.6 C) 97.7 F (36.5 C) 98 F (36.7 C)  TempSrc:  Oral Oral   SpO2: 100% 100% 100%   Weight:      Height:        LABS:  Recent Labs    12/03/19 0851 12/04/19 0526  WBC 12.4* 21.0*  HGB 11.7* 11.8*  HCT 36.6 36.7  PLT 332 329     Blood type: --/--/O POS (11/15 0845)  Rubella: Immune (05/26 0000)   Vaccines:   TDaP   UTD                   Flu       Declined                             COVID-19 UTD   Gen: AAO x 3, NAD  Abdomen: soft, non-tender, non-distended             Fundus: firm, non-tender, U-1  Perineum: intact  Lochia: moderate  Extremities: no edema, no calf pain or tenderness   Assessment/Plan PPD # 1 34 y.o., 06-12-1975   Principal Problem:   Postpartum care following vaginal delivery 11/15 Active Problems:   Encounter for induction of labor   SVD (spontaneous vaginal delivery) 11/15   Doing well - stable status  Routine post partum orders  Anticipate discharge tomorrow   12/15, MSN, CNM 12/04/2019, 1:21 PM

## 2019-12-04 NOTE — Anesthesia Postprocedure Evaluation (Signed)
Anesthesia Post Note  Patient: Biochemist, clinical  Procedure(s) Performed: AN AD HOC LABOR EPIDURAL     Patient location during evaluation: Mother Baby Anesthesia Type: Epidural Level of consciousness: awake and alert Pain management: pain level controlled Vital Signs Assessment: post-procedure vital signs reviewed and stable Respiratory status: spontaneous breathing, nonlabored ventilation and respiratory function stable Cardiovascular status: stable Postop Assessment: no headache, no backache, epidural receding, no apparent nausea or vomiting, adequate PO intake and able to ambulate Anesthetic complications: no   No complications documented.  Last Vitals:  Vitals:   12/04/19 0220 12/04/19 0644  BP: (!) 124/56 123/67  Pulse: 96 78  Resp: 16 18  Temp: 36.6 C 36.5 C  SpO2: 100% 100%    Last Pain:  Vitals:   12/04/19 0644  TempSrc: Oral  PainSc: 0-No pain   Pain Goal:                   Blythe Stanford

## 2019-12-04 NOTE — Lactation Note (Signed)
This note was copied from a baby's chart. Lactation Consultation Note  Patient Name: Allison Sheppard DVVOH'Y Date: 12/04/2019 Reason for consult: Initial assessment   Mother is a P2, infant is 43 hours old Mother was given [facility] brochure and basic teaching done.  Mother reports that infant is feeding well. Mother reports that she breastfed her first child for 1 month but had lots of difficulties.   Reviewed hand expression with mother. Observed large drops of colostrum.  Mother reports that she took Phentermine and lost 70 lbs before becoming pregnant. She reports that she gained all the weight back while pregnant.  Mother ask if she could take this medication while breastfeeding.  [name] Medication and Mothers Milk reports that it is classified as a L-4 and not recommended in lactation. Informed mother of this information.   Encouraged mother to continue to breastfeed and informed mother that breastfeeding does enable her to burn more calories with the same healthy eating.   Mother to continue to cue base feed infant and feed at least 8-12 times or more in 24 hours and advised to allow for cluster feeding infant as needed.  Mother to continue to due STS. Mother is aware of available LC services at [facility], BFSG'S, OP Dept, and phone # for questions or concerns about breastfeeding.  Mother receptive to all teaching and plan of care.     Maternal Data Has patient been taught Hand Expression?: Yes Does the patient have breastfeeding experience prior to this delivery?: Yes  Feeding Feeding Type: Breast Fed  LATCH Score Latch: Grasps breast easily, tongue down, lips flanged, rhythmical sucking.  Audible Swallowing: A few with stimulation  Type of Nipple: Everted at rest and after stimulation  Comfort (Breast/Nipple): Soft / non-tender  Hold (Positioning): Assistance needed to correctly position infant at breast and maintain latch.  LATCH Score: 8  Interventions Interventions:  Breast feeding basics reviewed  Lactation Tools Discussed/Used WIC Program: No   Consult Status Consult Status: Follow-up Date: 12/05/19 Follow-up type: In-patient    Stevan Born [facility] 12/04/2019, 11:46 AM

## 2019-12-05 MED ORDER — IBUPROFEN 600 MG PO TABS: 600.0000 mg | ORAL_TABLET | Freq: Four times a day (QID) | ORAL | 0 refills | Status: AC

## 2019-12-05 NOTE — Discharge Summary (Signed)
Postpartum Discharge Summary  Date of Service updated 12/05/19    Patient Name: Allison Sheppard DOB: October 27, 1985 MRN: [id number]  Date of admission: 12/03/2019 Delivery date:12/03/2019  Delivering provider: Noralyn Pick  Date of discharge: 12/05/2019  Admitting diagnosis: Encounter for induction of labor [Z34.90] Intrauterine pregnancy: [redacted]w[redacted]d    Secondary diagnosis:  Principal Problem:   Postpartum care following vaginal delivery 11/15 Active Problems:   Encounter for induction of labor   SVD (spontaneous vaginal delivery) 11/15  Additional problems: none    Discharge diagnosis: Term Pregnancy Delivered                                              Post partum procedures:none Augmentation: AROM and Pitocin Complications: None  Hospital course: Induction of Labor With Vaginal Delivery   34y.o. yo [id number] admitted to the hospital 12/03/2019 for induction of labor.  Indication for induction: BMI=56.  Patient had an uncomplicated labor course as follows: Membrane Rupture Time/Date: 2:13 PM ,12/03/2019   Delivery Method:Vaginal, Spontaneous  Episiotomy: None  Lacerations:  None  Details of delivery can be found in separate delivery note.  Patient had a routine postpartum course. Patient is discharged home 12/05/19.  Newborn Data: Birth date:12/03/2019  Birth time:10:35 PM  Gender:Female  Living status:Living  Apgars:8 ,9  Weight:3595 g   Magnesium Sulfate received: No BMZ received: No Rhophylac:N/A MMR:N/A T-DaP:Given prenatally Flu: No Transfusion:No  Physical exam  Vitals:   12/04/19 1111 12/04/19 1420 12/04/19 2104 12/05/19 0522  BP: 95/71 118/68 123/72 119/74  Pulse: 85 77 80 83  Resp: _0 Temp: 98 F (36.7 C) 98.3 F (36.8 C) 98.7 F (37.1 C) 98 F (36.7 C)  TempSrc:  Oral Oral Oral  SpO2:  100% 100% 100%  Weight:      Height:       General: alert, cooperative and no distress Lochia: appropriate Uterine Fundus:  firm Incision: N/A DVT Evaluation: No evidence of DVT seen on physical exam. No cords or calf tenderness. No significant calf/ankle edema. Labs: Lab Results  Component Value Date   WBC 21.0 (H) 12/04/2019   HGB 11.8 (L) 12/04/2019   HCT 36.7 12/04/2019   MCV 84.8 12/04/2019   PLT 329 12/04/2019   CMP Latest Ref Rng & Units 11/27/2012  Glucose 70 - 99 mg/dL 103(H)  BUN 6 - 23 mg/dL 8  Creatinine 0.50 - 1.10 mg/dL 0.68  Sodium 135 - 145 mEq/L 138  Potassium 3.5 - 5.1 mEq/L 3.6  Chloride 96 - 112 mEq/L 104  CO2 19 - 32 mEq/L 25  Calcium 8.4 - 10.5 mg/dL 8.5  Total Protein 6.0 - 8.3 g/dL 6.2  Total Bilirubin 0.3 - 1.2 mg/dL 0.3  Alkaline Phos 39 - 117 U/L 80  AST 0 - 37 U/L 22  ALT 0 - 35 U/L 18   Edinburgh Score: Edinburgh Postnatal Depression Scale Screening Tool 12/04/2019  I have been able to laugh and see the funny side of things. 0  I have looked forward with enjoyment to things. 0  I have blamed myself unnecessarily when things went wrong. 1  I have been anxious or worried for no good reason. 0  I have felt scared or panicky for no good reason. 1  Things have been getting on top of me. 0  I have been so  unhappy that I have had difficulty sleeping. 0  I have felt sad or miserable. 0  I have been so unhappy that I have been crying. 0  The thought of harming myself has occurred to me. 0  Edinburgh Postnatal Depression Scale Total 2      After visit meds:  Allergies as of 12/05/2019   No Known Allergies     Medication List    STOP taking these medications   cetirizine 10 MG tablet Commonly known as: ZyrTEC Allergy   [employer] PRENATAL GUMMY PO   ipratropium 0.06 % nasal spray Commonly known as: Atrovent   Iron Polysacch Cmplx-B12-FA 150-0.025-1 MG Caps   valACYclovir 500 MG tablet Commonly known as: VALTREX     TAKE these medications   acetaminophen 500 MG tablet Commonly known as: TYLENOL Take 500 mg by mouth every 6 (six) hours as needed for mild pain  or headache.   Flintstones Complete 18 MG Chew Chew 2 tablets by mouth daily.   ibuprofen 600 MG tablet Commonly known as: ADVIL Take 1 tablet (600 mg total) by mouth every 6 (six) hours.        Discharge home in stable condition Infant Feeding: Breast Infant Disposition:home with mother Discharge instruction: per After Visit Summary and Postpartum booklet. Activity: Advance as tolerated. Pelvic rest for 6 weeks.  Diet: low salt diet Anticipated Birth Control: Condoms Postpartum Appointment:6 weeks Additional Postpartum F/U: none Future Appointments:No future appointments. Follow up Visit:  [facility]. Schedule an appointment as soon as possible for a visit in 6 week(s).   Specialty: Obstetrics and Gynecology Contact information: [address] [place] [id number] [phone]                  12/05/2019 Arrie Eastern, CNM

## 2019-12-05 NOTE — Discharge Instructions (Signed)

## 2019-12-05 NOTE — Lactation Note (Signed)
This note was copied from a baby's chart. Lactation Consultation Note  Patient Name: Boy Kemaya Dorner KJZPH'X Date: 12/05/2019 Reason for consult: Follow-up assessment;Term;Infant weight loss;Other (Comment) (5 % weight loss)  Baby is 34 hours old  Skin Bilirubin - 9.6 at 31 hours - after baby fed for 13 mins lab present to  Do the serum Bilirubin.  LC assisted mom to change the diaper and while changing it the baby wet over moms bed ( Large ).  LC assisted to show mom the football position on the left breast , with adequate support. Baby opened wide and latched. LC instructed mom on the use of breast compressions until the baby was in a consistent swallowing pattern.  LC applied a warm compress on the breast above the baby and per mom this is the most consistent he has been. Baby fed 13 mins and per om comfortable.  LC did note a short skin notch under the midsection of the tongue and mentioned it to Dr. Maisie Fus when she came to examine the baby.  Sore nipple and engorgement prevention and tx reviewed.  Per  Mom has a hand pump and a DEBP at home.  Mom aware of the BFSG and the [facility] resource phone numbers.    Maternal Data Has patient been taught Hand Expression?: Yes Does the patient have breastfeeding experience prior to this delivery?: Yes  Feeding- baby fed for 13 mins     LATCH Score Latch: Grasps breast easily, tongue down, lips flanged, rhythmical sucking.  Audible Swallowing: Spontaneous and intermittent  Type of Nipple: Everted at rest and after stimulation  Comfort (Breast/Nipple): Soft / non-tender  Hold (Positioning): Assistance needed to correctly position infant at breast and maintain latch.  LATCH Score: 9  Interventions Interventions: Breast feeding basics reviewed  Lactation Tools Discussed/Used Pump Review: Milk Storage   Consult Status Consult Status: Complete Date: 12/05/19    Kathrin Greathouse 12/05/2019, 9:29 AM

## 2021-06-23 ENCOUNTER — Encounter: Payer: Self-pay | Admitting: Nurse Practitioner

## 2021-06-23 ENCOUNTER — Ambulatory Visit (INDEPENDENT_AMBULATORY_CARE_PROVIDER_SITE_OTHER): Payer: 59 | Admitting: Nurse Practitioner

## 2021-06-23 ENCOUNTER — Telehealth: Payer: Self-pay

## 2021-06-23 VITALS — BP 126/74 | HR 81 | Temp 98.7°F | Ht 62.0 in | Wt 251.2 lb

## 2021-06-23 DIAGNOSIS — R5383 Other fatigue: Secondary | ICD-10-CM

## 2021-06-23 DIAGNOSIS — Z1159 Encounter for screening for other viral diseases: Secondary | ICD-10-CM | POA: Insufficient documentation

## 2021-06-23 DIAGNOSIS — E559 Vitamin D deficiency, unspecified: Secondary | ICD-10-CM | POA: Insufficient documentation

## 2021-06-23 DIAGNOSIS — N915 Oligomenorrhea, unspecified: Secondary | ICD-10-CM | POA: Insufficient documentation

## 2021-06-23 DIAGNOSIS — Z131 Encounter for screening for diabetes mellitus: Secondary | ICD-10-CM | POA: Diagnosis not present

## 2021-06-23 DIAGNOSIS — Z136 Encounter for screening for cardiovascular disorders: Secondary | ICD-10-CM | POA: Diagnosis not present

## 2021-06-23 DIAGNOSIS — K1370 Unspecified lesions of oral mucosa: Secondary | ICD-10-CM | POA: Insufficient documentation

## 2021-06-23 DIAGNOSIS — M722 Plantar fascial fibromatosis: Secondary | ICD-10-CM

## 2021-06-23 DIAGNOSIS — Z6841 Body Mass Index (BMI) 40.0 and over, adult: Secondary | ICD-10-CM

## 2021-06-23 LAB — COMPREHENSIVE METABOLIC PANEL
ALT: 17 U/L (ref 0–35)
AST: 19 U/L (ref 0–37)
Albumin: 4.1 g/dL (ref 3.5–5.2)
Alkaline Phosphatase: 77 U/L (ref 39–117)
BUN: 9 mg/dL (ref 6–23)
CO2: 28 mEq/L (ref 19–32)
Calcium: 9.3 mg/dL (ref 8.4–10.5)
Chloride: 102 mEq/L (ref 96–112)
Creatinine, Ser: 0.66 mg/dL (ref 0.40–1.20)
GFR: 115.14 mL/min (ref >60.00)
Glucose, Bld: 84 mg/dL (ref 70–99)
Potassium: 3.7 mEq/L (ref 3.5–5.1)
Sodium: 137 mEq/L (ref 135–145)
Total Bilirubin: 0.4 mg/dL (ref 0.2–1.2)
Total Protein: 7.5 g/dL (ref 6.0–8.3)

## 2021-06-23 LAB — LIPID PANEL
Cholesterol: 169 mg/dL (ref 0–200)
HDL: 50.7 mg/dL (ref >39.00)
LDL Cholesterol: 106 mg/dL — ABNORMAL HIGH (ref 0–99)
NonHDL: 118.1
Total CHOL/HDL Ratio: 3
Triglycerides: 60 mg/dL (ref 0.0–149.0)
VLDL: 12 mg/dL (ref 0.0–40.0)

## 2021-06-23 LAB — TSH: TSH: 1.28 u[IU]/mL (ref 0.35–5.50)

## 2021-06-23 LAB — CBC
HCT: 39.3 % (ref 36.0–46.0)
Hemoglobin: 12.8 g/dL (ref 12.0–15.0)
MCHC: 32.5 g/dL (ref 30.0–36.0)
MCV: 84.9 fl (ref 78.0–100.0)
Platelets: 382 10*3/uL (ref 150.0–400.0)
RBC: 4.64 Mil/uL (ref 3.87–5.11)
RDW: 15.3 % (ref 11.5–15.5)
WBC: 10.7 10*3/uL — ABNORMAL HIGH (ref 4.0–10.5)

## 2021-06-23 LAB — HEMOGLOBIN A1C: Hgb A1c MFr Bld: 5.9 % (ref 4.6–6.5)

## 2021-06-23 NOTE — Assessment & Plan Note (Signed)
Bilateral foot pain sounds consistent with plantar fascitis. Recommended gentle stretching exercises and if symptoms worsen or do not improve will consider referral to podiatry.

## 2021-06-23 NOTE — Progress Notes (Signed)
New Patient Office Visit  Subjective    Patient ID: Allison Sheppard Barbara, female    DOB: 10-12-1985  Age: 36 y.o. MRN: [id number]  CC:  Chief Complaint  Patient presents with   New Patient (Initial Visit)    HPI Allison Sheppard Walt presents to establish care. She was referred to me by another patient in the practice. She has 3 concerns today as listed below:  Color changes to mouth: present x 1 month. Has history of heavy menstrual bleeding. No known history of anemia. Has intermittent fatigue. Lesions as flat, they are not painful, not draining, do not itch. She is current smoker, 0.33-0.5 ppd x 21 years.   2. Feet pain: Has been present since 2017/2018. It is bilateral heel pain. Worse first thing in the morning or if after sitting for prolonged period of time. Has been treated by foot specialist in the past. She is not aware of what her diagnosis was at that time. She was treated with injections and foot brace without much improvement.   3. Elevated blood pressure: She was seen at her "skin" doctor (she is being treated with steroid injections in her keloids) and she was told her BP was >/=160 systolically. She wanted to have this evaluated today. She denies any other history of elevated BP. She does report being nervous the day she was told her BP was high.     Outpatient Encounter Medications as of 06/23/2021  Medication Sig   phentermine 30 MG capsule    acetaminophen (TYLENOL) 500 MG tablet Take 500 mg by mouth every 6 (six) hours as needed for mild pain or headache. (Patient not taking: Reported on 06/23/2021)   ibuprofen (ADVIL) 600 MG tablet Take 1 tablet (600 mg total) by mouth every 6 (six) hours. (Patient not taking: Reported on 06/23/2021)   [DISCONTINUED] Pediatric Multivitamins-Iron (FLINTSTONES COMPLETE) 18 MG CHEW Chew 2 tablets by mouth daily. (Patient not taking: Reported on 06/23/2021)   No facility-administered encounter medications on file as of 06/23/2021.    Past  Medical History:  Diagnosis Date   Abnormal Pap smear    Chlamydia    Elective abortion     Past Surgical History:  Procedure Laterality Date   DILATION AND CURETTAGE OF UTERUS     NO PAST SURGERIES      Family History  Problem Relation Age of Onset   Other Neg Hx     Social History   Socioeconomic History   Marital status: Single    Spouse name: Not on file   Number of children: Not on file   Years of education: Not on file   Highest education level: Not on file  Occupational History   Not on file  Tobacco Use   Smoking status: Every Day    Packs/day: 0.50    Types: Cigarettes    Last attempt to quit: 10/05/2011    Years since quitting: 9.7   Smokeless tobacco: Never  Vaping Use   Vaping Use: Never used  Substance and Sexual Activity   Alcohol use: No   Drug use: No   Sexual activity: Not Currently    Birth control/protection: Pill  Other Topics Concern   Not on file  Social History Narrative   Not on file   Social Determinants of Health   Financial Resource Strain: Not on file  Food Insecurity: Not on file  Transportation Needs: Not on file  Physical Activity: Not on file  Stress: Not on file  Social  Connections: Not on file  Intimate Partner Violence: Not on file    Review of Systems  Constitutional:  Positive for malaise/fatigue. Negative for fever and weight loss.  Respiratory:  Negative for cough and shortness of breath.   Cardiovascular:  Negative for chest pain and palpitations.  Gastrointestinal:  Negative for abdominal pain and blood in stool.  Neurological:  Negative for dizziness and headaches.  Psychiatric/Behavioral:  Negative for depression and suicidal ideas. The patient is nervous/anxious.        Objective    BP 126/74 (BP Location: Left Arm, Patient Position: Sitting, Cuff Size: Large)   Pulse 81   Temp 98.7 F (37.1 C) (Oral)   Ht 5\' 2"  (1.575 m)   Wt 251 lb 2.7 oz (113.9 kg)   LMP 06/08/2021   SpO2 98%   BMI 45.94 kg/m    Physical Exam Vitals reviewed.  Constitutional:      General: She is not in acute distress.    Appearance: Normal appearance.  HENT:     Head: Normocephalic and atraumatic.     Mouth/Throat:     Mouth: Oral lesions present.      Comments: Red areas indicate lesions of concern. They are flat, no discharge noted. Hyperpigmented.  Neck:     Vascular: No carotid bruit.  Cardiovascular:     Rate and Rhythm: Normal rate and regular rhythm.     Pulses: Normal pulses.     Heart sounds: Normal heart sounds.  Pulmonary:     Effort: Pulmonary effort is normal.     Breath sounds: Normal breath sounds.  Skin:    General: Skin is warm and dry.  Neurological:     General: No focal deficit present.     Mental Status: She is alert and oriented to person, place, and time.  Psychiatric:        Mood and Affect: Mood normal.        Behavior: Behavior normal.        Judgment: Judgment normal.        Assessment & Plan:   Problem List Items Addressed This Visit   None Visit Diagnoses     Mouth lesion    -  Primary   Relevant Orders   TSH   Hemoglobin A1c   Lipid panel   Comprehensive metabolic panel   CBC   Iron, TIBC and Ferritin Panel   Class 3 severe obesity without serious comorbidity with body mass index (BMI) of 45.0 to 49.9 in adult, unspecified obesity type (HCC)       Relevant Medications   phentermine 30 MG capsule   Other Relevant Orders   TSH   Hemoglobin A1c   Lipid panel   Comprehensive metabolic panel   CBC   Iron, TIBC and Ferritin Panel   Screening for cardiovascular condition       Relevant Orders   TSH   Hemoglobin A1c   Lipid panel   Comprehensive metabolic panel   CBC   Iron, TIBC and Ferritin Panel   Screening for diabetes mellitus       Relevant Orders   TSH   Hemoglobin A1c   Lipid panel   Comprehensive metabolic panel   CBC   Iron, TIBC and Ferritin Panel   Fatigue, unspecified type       Relevant Orders   TSH   Hemoglobin A1c   Lipid  panel   Comprehensive metabolic panel   CBC   Iron, TIBC and Ferritin Panel  Encounter for hepatitis C screening test for low risk patient       Relevant Orders   Hepatitis C antibody       Return in about 1 month (around 07/23/2021) for Follow-up with Treesa Mccully.   Elenore Paddy, NP

## 2021-06-23 NOTE — Assessment & Plan Note (Signed)
Etiology uncertain. Back-up supervising physician asked to also come in and evaluate the lesions. He reports this is consistent with hyperpigmentation of the mouth associated with iron deficiency. Will check CBC and iron levels today for further evaluation, further recommendations may be made based on results.

## 2021-06-23 NOTE — Assessment & Plan Note (Signed)
Blood work ordered for further evaluation today, Further recommendations may be made based on these results.

## 2021-06-23 NOTE — Telephone Encounter (Signed)
Record request from [place] for patient records. [phone]

## 2021-06-23 NOTE — Assessment & Plan Note (Signed)
Blood work ordered for further evaluation, Further recommendations may be made based on these results.   

## 2021-06-23 NOTE — Assessment & Plan Note (Signed)
Blood work ordered today, Further recommendations may be made based on these results.   

## 2021-06-23 NOTE — Assessment & Plan Note (Signed)
Hep c antibody ordered today, Further recommendations may be made based on these results.

## 2021-06-23 NOTE — Assessment & Plan Note (Signed)
Blood work ordered today and Further recommendations may be made based on these results.

## 2021-06-24 LAB — IRON,TIBC AND FERRITIN PANEL
%SAT: 10 % (calc) — ABNORMAL LOW (ref 16–45)
Ferritin: 6 ng/mL — ABNORMAL LOW (ref 16–154)
Iron: 34 ug/dL — ABNORMAL LOW (ref 40–190)
TIBC: 343 mcg/dL (calc) (ref 250–450)

## 2021-06-24 LAB — HEPATITIS C ANTIBODY
Hepatitis C Ab: NONREACTIVE
SIGNAL TO CUT-OFF: 0.2 (ref <1.00)

## 2021-06-25 ENCOUNTER — Ambulatory Visit: Payer: 59 | Admitting: Nurse Practitioner

## 2021-07-23 ENCOUNTER — Telehealth: Payer: Self-pay | Admitting: Nurse Practitioner

## 2021-07-23 ENCOUNTER — Ambulatory Visit (INDEPENDENT_AMBULATORY_CARE_PROVIDER_SITE_OTHER): Payer: 59 | Admitting: Nurse Practitioner

## 2021-07-23 VITALS — BP 128/72 | HR 70 | Temp 98.8°F | Ht 62.0 in | Wt 253.0 lb

## 2021-07-23 DIAGNOSIS — E611 Iron deficiency: Secondary | ICD-10-CM | POA: Insufficient documentation

## 2021-07-23 DIAGNOSIS — K1370 Unspecified lesions of oral mucosa: Secondary | ICD-10-CM | POA: Diagnosis not present

## 2021-07-23 DIAGNOSIS — M722 Plantar fascial fibromatosis: Secondary | ICD-10-CM | POA: Diagnosis not present

## 2021-07-23 DIAGNOSIS — Z6841 Body Mass Index (BMI) 40.0 and over, adult: Secondary | ICD-10-CM

## 2021-07-23 MED ORDER — ACCRUFER 30 MG PO CAPS: 30.0000 mg | ORAL_CAPSULE | Freq: Two times a day (BID) | ORAL | 2 refills | Status: DC

## 2021-07-23 NOTE — Progress Notes (Signed)
Established Patient Office Visit  Subjective   Patient ID: Allison Sheppard, female    DOB: 09/04/1985  Age: 35 y.o. MRN: [id number]  Chief Complaint  Patient presents with   86mo follow up    No concerns     Oral hyperpigmentation: Low Iron - did not tolerate the iron supplements that were prescribed. She stated that they caused stomach upset whether she ate or not.  -Foot pain - getting better, has not been bothering her lately -Elevated BP - today her BP was 128/72, patient denies CP, SOB, or dizziness. Patient also stated that she takes phentermine sometimes and that will raise her BP also.      Review of Systems  Constitutional:  Negative for fever.  Respiratory:  Negative for shortness of breath.   Cardiovascular:  Negative for chest pain.  Gastrointestinal:  Negative for abdominal pain, constipation, nausea and vomiting.  Musculoskeletal:  Negative for joint pain and myalgias.  Skin:  Negative for rash.      Objective:     BP 128/72 (BP Location: Right Arm, Patient Position: Sitting, Cuff Size: Large)   Pulse 70   Temp 98.8 F (37.1 C) (Oral)   Ht 5\' 2"  (1.575 m)   Wt 253 lb (114.8 kg)   LMP 06/08/2021   SpO2 97%   BMI 46.27 kg/m  BP Readings from Last 3 Encounters:  07/23/21 128/72  06/23/21 126/74  12/05/19 119/74      Physical Exam Constitutional:      Appearance: She is obese.  Cardiovascular:     Rate and Rhythm: Normal rate and regular rhythm.  Pulmonary:     Effort: Pulmonary effort is normal.     Breath sounds: Normal breath sounds.  Skin:    General: Skin is warm.     Comments: Hyperpigmentation of the lower lip and oral mucosa in the mouth.   Neurological:     Mental Status: She is alert and oriented to person, place, and time.      No results found for any visits on 07/23/21.  Last CBC Lab Results  Component Value Date   WBC 10.7 (H) 06/23/2021   HGB 12.8 06/23/2021   HCT 39.3 06/23/2021   MCV 84.9 06/23/2021   MCH  27.3 12/04/2019   RDW 15.3 06/23/2021   PLT 382.0 06/23/2021   Last metabolic panel Lab Results  Component Value Date   GLUCOSE 84 06/23/2021   NA 137 06/23/2021   K 3.7 06/23/2021   CL 102 06/23/2021   CO2 28 06/23/2021   BUN 9 06/23/2021   CREATININE 0.66 06/23/2021   GFRNONAA >90 11/27/2012   CALCIUM 9.3 06/23/2021   PROT 7.5 06/23/2021   ALBUMIN 4.1 06/23/2021   BILITOT 0.4 06/23/2021   ALKPHOS 77 06/23/2021   AST 19 06/23/2021   ALT 17 06/23/2021   Last hemoglobin A1c Lab Results  Component Value Date   HGBA1C 5.9 06/23/2021      The ASCVD Risk score (Arnett DK, et al., 2019) failed to calculate for the following reasons:   The 2019 ASCVD risk score is only valid for ages 80 to 23    Assessment & Plan:   Problem List Items Addressed This Visit       Digestive   Mouth lesion    Etiology uncertain. Iron = 34 and ferritin = 6, levels low and consistent with supervising physician diagnosis. Patient will try the Accrufer 30 mg BID since OTC ferrous sulfate was not tolerated. FU  in 4 weeks to recheck labs. May consider checking cortisol level to assess for Addison's diease.         Musculoskeletal and Integument   Plantar fasciitis, bilateral    Patient reports improvement in foot pain with gentle stretches. Will refer to podiatry if symptoms persist.         Other   Class 3 severe obesity without serious comorbidity with body mass index (BMI) of 45.0 to 49.9 in adult [facility])    Patient on phentermine. Will discuss other weight management options in addition to healthy lifestyle changes at next visit.       Iron deficiency - Primary    Patient was unable to tolerate OTC iron supplement (ferrous sulfate). Gave patient some samples of Accrufer to try and will send in a RX for patient. FU in 4 weeks to recheck labs and to see if hyperpigmentation has resolved.       Relevant Medications   Ferric Maltol (ACCRUFER) 30 MG CAPS    Return in about 1 month (around  08/23/2021).    Elenore Paddy, NP

## 2021-07-23 NOTE — Assessment & Plan Note (Signed)
Patient on phentermine. Will discuss other weight management options in addition to healthy lifestyle changes at next visit.

## 2021-07-23 NOTE — Assessment & Plan Note (Signed)
Patient was unable to tolerate OTC iron supplement (ferrous sulfate). Gave patient some samples of Accrufer to try and will send in a RX for patient. FU in 4 weeks to recheck labs and to see if hyperpigmentation has resolved.

## 2021-07-23 NOTE — Addendum Note (Signed)
Addended by: Jiles Prows E on: 07/23/2021 05:41 PM   Modules accepted: Orders

## 2021-07-23 NOTE — Assessment & Plan Note (Addendum)
Etiology uncertain. Iron = 34 and ferritin = 6, levels low and consistent with supervising physician diagnosis. Patient will try the Accrufer 30 mg BID since OTC ferrous sulfate was not tolerated. FU in 4 weeks to recheck labs. May consider checking cortisol level to assess for Addison's diease.

## 2021-07-23 NOTE — Assessment & Plan Note (Signed)
Patient reports improvement in foot pain with gentle stretches. Will refer to podiatry if symptoms persist.

## 2021-07-23 NOTE — Telephone Encounter (Signed)
Please schedule patient for f/u with me in 4-6 weeks. She is okay with any day, please do earliest in the morning (8am) if possible with me. Then call her to let her know of appt time and date. Thank you.

## 2021-07-24 NOTE — Telephone Encounter (Signed)
Appointment scheduled and patient notified.

## 2021-09-03 ENCOUNTER — Telehealth: Payer: 59 | Admitting: Nurse Practitioner

## 2021-09-17 ENCOUNTER — Telehealth: Payer: Self-pay | Admitting: Hematology and Oncology

## 2021-09-17 ENCOUNTER — Ambulatory Visit (INDEPENDENT_AMBULATORY_CARE_PROVIDER_SITE_OTHER): Payer: 59 | Admitting: Nurse Practitioner

## 2021-09-17 ENCOUNTER — Encounter: Payer: Self-pay | Admitting: Nurse Practitioner

## 2021-09-17 VITALS — BP 142/104 | HR 72 | Temp 98.2°F | Ht 62.0 in | Wt 256.1 lb

## 2021-09-17 DIAGNOSIS — F419 Anxiety disorder, unspecified: Secondary | ICD-10-CM | POA: Diagnosis not present

## 2021-09-17 DIAGNOSIS — E611 Iron deficiency: Secondary | ICD-10-CM

## 2021-09-17 DIAGNOSIS — Z6841 Body Mass Index (BMI) 40.0 and over, adult: Secondary | ICD-10-CM

## 2021-09-17 MED ORDER — SERTRALINE HCL 50 MG PO TABS: 50.0000 mg | ORAL_TABLET | Freq: Every day | ORAL | 1 refills | Status: DC

## 2021-09-17 NOTE — Telephone Encounter (Signed)
Scheduled appt per 8/31 referral. Pt is aware of appt date and time. Pt is aware to arrive 15 mins prior to appt time and to bring and updated insurance card. Pt is aware of appt location.   

## 2021-09-17 NOTE — Assessment & Plan Note (Signed)
Patient encouraged to continue focusing on lifestyle modification aimed at helping her with weight loss.

## 2021-09-17 NOTE — Assessment & Plan Note (Signed)
Anemia has historically been mild however iron and ferritin levels have been quite low.  She has not tolerated or cannot afford oral options at this point.  Additionally with her hyperpigmentation of her mouth we will refer her to hematology for further evaluation to determine if she may be a candidate for IV iron.  If hyperpigmentation persist despite improvement in iron levels will need to refer to oral surgeon for further evaluation.

## 2021-09-17 NOTE — Progress Notes (Signed)
Established Patient Office Visit  Subjective   Patient ID: Allison Sheppard, female    DOB: March 28, 1985  Age: 36 y.o. MRN: [id number]  Chief Complaint  Patient presents with   Follow-up    Still felling tired, do not feel the iron pills she taking is making any effects    Anemia    Anemia/fatigue: Patient has iron deficiency anemia. She has trialed over-the-counter iron but did not tolerate the side effects.  She is also tried after for however it was too expensive so she is no longer taking this.  She does continue to have significant fatigue.  Additionally, she continues to have the hyperpigmentation on the roof of her mouth which originally was thought to be due to her anemia.  Blood work shows normal kidney function and electrolytes, mild hyperlipidemia with LDL of 106, iron and ferritin continue to be low, A1c in the prediabetic range, TSH normal.  Patient denies any family history of autoimmune disease or personal history of autoimmune disease.  She does have a history of anxiety but denies suicidal ideation.  Anxiety: Not well controlled.  Patient does report history of taking antidepressant and she believes it was Zoloft.  Obesity: Patient has been on phentermine in the past but is not taking it currently.    Review of Systems  Constitutional:  Negative for fever.  Respiratory:  Negative for shortness of breath.   Cardiovascular:  Positive for palpitations (intermittent, no triggers very infrequent). Negative for chest pain.  Psychiatric/Behavioral:  Negative for depression and suicidal ideas. The patient is nervous/anxious.       Objective:     BP (!) 142/104   Pulse 72   Temp 98.2 F (36.8 C) (Oral)   Ht 5\' 2"  (1.575 m)   Wt 256 lb 2 oz (116.2 kg)   LMP 08/31/2021   SpO2 99%   BMI 46.85 kg/m  BP Readings from Last 3 Encounters:  09/17/21 (!) 142/104  07/23/21 128/72  06/23/21 126/74   Wt Readings from Last 3 Encounters:  09/17/21 256 lb 2 oz (116.2 kg)   07/23/21 253 lb (114.8 kg)  06/23/21 251 lb 2.7 oz (113.9 kg)      Physical Exam Vitals reviewed.  Constitutional:      General: She is not in acute distress.    Appearance: Normal appearance.  HENT:     Head: Normocephalic and atraumatic.  Neck:     Vascular: No carotid bruit.  Cardiovascular:     Rate and Rhythm: Normal rate and regular rhythm.     Pulses: Normal pulses.     Heart sounds: Normal heart sounds.  Pulmonary:     Effort: Pulmonary effort is normal.     Breath sounds: Normal breath sounds.  Skin:    General: Skin is warm and dry.  Neurological:     General: No focal deficit present.     Mental Status: She is alert and oriented to person, place, and time.  Psychiatric:        Mood and Affect: Mood normal.        Behavior: Behavior normal.        Judgment: Judgment normal.      No results found for any visits on 09/17/21.    The ASCVD Risk score (Arnett DK, et al., 2019) failed to calculate for the following reasons:   The 2019 ASCVD risk score is only valid for ages 99 to 22    Assessment & Plan:   Problem  List Items Addressed This Visit       Other   Class 3 severe obesity without serious comorbidity with body mass index (BMI) of 45.0 to 49.9 in adult [facility])    Patient encouraged to continue focusing on lifestyle modification aimed at helping her with weight loss.      Iron deficiency    Anemia has historically been mild however iron and ferritin levels have been quite low.  She has not tolerated or cannot afford oral options at this point.  Additionally with her hyperpigmentation of her mouth we will refer her to hematology for further evaluation to determine if she may be a candidate for IV iron.  If hyperpigmentation persist despite improvement in iron levels will need to refer to oral surgeon for further evaluation.      Relevant Orders   Ambulatory referral to Hematology / Oncology   Anxiety - Primary    Per shared decision making will  treat with low dose sertraline 50mg /day to see if this helps her anxiety and also fatigue. Patient to follow-up in 6 weeks to see how she is tolerating the medication.       Relevant Medications   sertraline (ZOLOFT) 50 MG tablet    Return in about 6 weeks (around 10/29/2021) for tele visit with Homer Pfeifer.    12/29/2021, NP

## 2021-09-17 NOTE — Assessment & Plan Note (Signed)
Per shared decision making will treat with low dose sertraline 50mg /day to see if this helps her anxiety and also fatigue. Patient to follow-up in 6 weeks to see how she is tolerating the medication.

## 2021-10-06 ENCOUNTER — Encounter: Payer: Self-pay | Admitting: Hematology and Oncology

## 2021-10-06 ENCOUNTER — Inpatient Hospital Stay: Payer: 59 | Attending: Hematology and Oncology | Admitting: Hematology and Oncology

## 2021-10-06 ENCOUNTER — Other Ambulatory Visit: Payer: Self-pay

## 2021-10-06 ENCOUNTER — Inpatient Hospital Stay: Payer: 59

## 2021-10-06 VITALS — BP 143/81 | HR 80 | Temp 98.4°F | Resp 16 | Ht 62.0 in | Wt 253.9 lb

## 2021-10-06 DIAGNOSIS — F17210 Nicotine dependence, cigarettes, uncomplicated: Secondary | ICD-10-CM | POA: Insufficient documentation

## 2021-10-06 DIAGNOSIS — D509 Iron deficiency anemia, unspecified: Secondary | ICD-10-CM

## 2021-10-06 LAB — CBC WITH DIFFERENTIAL/PLATELET
Abs Immature Granulocytes: 0.03 10*3/uL (ref 0.00–0.07)
Basophils Absolute: 0.1 10*3/uL (ref 0.0–0.1)
Basophils Relative: 1 %
Eosinophils Absolute: 0.1 10*3/uL (ref 0.0–0.5)
Eosinophils Relative: 1 %
HCT: 38.6 % (ref 36.0–46.0)
Hemoglobin: 12.9 g/dL (ref 12.0–15.0)
Immature Granulocytes: 0 %
Lymphocytes Relative: 31 %
Lymphs Abs: 3 10*3/uL (ref 0.7–4.0)
MCH: 27.7 pg (ref 26.0–34.0)
MCHC: 33.4 g/dL (ref 30.0–36.0)
MCV: 83 fL (ref 80.0–100.0)
Monocytes Absolute: 0.6 10*3/uL (ref 0.1–1.0)
Monocytes Relative: 6 %
Neutro Abs: 6.1 10*3/uL (ref 1.7–7.7)
Neutrophils Relative %: 61 %
Platelets: 397 10*3/uL (ref 150–400)
RBC: 4.65 MIL/uL (ref 3.87–5.11)
RDW: 15 % (ref 11.5–15.5)
WBC: 9.8 10*3/uL (ref 4.0–10.5)
nRBC: 0 % (ref 0.0–0.2)

## 2021-10-06 LAB — IRON AND IRON BINDING CAPACITY (CC-WL,HP ONLY)
Iron: 29 ug/dL (ref 28–170)
Saturation Ratios: 8 % — ABNORMAL LOW (ref 10.4–31.8)
TIBC: 386 ug/dL (ref 250–450)
UIBC: 357 ug/dL (ref 148–442)

## 2021-10-06 LAB — HCG, SERUM, QUALITATIVE: Preg, Serum: NEGATIVE

## 2021-10-06 LAB — FERRITIN: Ferritin: 5 ng/mL — ABNORMAL LOW (ref 11–307)

## 2021-10-06 NOTE — Progress Notes (Signed)
[facility] CONSULT NOTE  Patient Care Team: Ailene Ards, NP as PCP - General (Nurse Practitioner)  CHIEF COMPLAINTS/PURPOSE OF CONSULTATION:  Iron deficiency anemia  ASSESSMENT & PLAN:   This is a very pleasant 36 year old female patient with no significant past medical history referred to hematology for evaluation and recommendations regarding iron deficiency anemia.  Patient complains of severe fatigue otherwise denies any new symptoms.  She has tried oral iron for about a month but did not see any improvement in fatigue hence discontinued it.  Her last labs were back in June which showed mild anemia and severe iron deficiency with a ferritin of 6.  She has monthly menstrual cycles, also gave birth a year ago and breast-fed her last child.  No other bleeding complaints reported.  Physical examination today healthy-appearing young female with no obvious concerns.  We have reviewed labs from June which show mild anemia and a ferritin of 6 consistent with iron deficiency.  I have recommended repeating CBC, iron panel and ferritin today, CBC shows normal hemoglobin.  No obvious explanation for this fatigue.  We are awaiting ferritin.  If her ferritin is low normal, I would recommend a trial of oral iron with ferrous sulfate 65/325 twice daily for at least 3 months and then repeat return to clinic for repeat labs.  I do not believe she requires intravenous iron at this time.  We have done a urine pregnancy test today which is negative. Thank you for consulting [place] the care of this patient.  Please do not hesitate to contact us with any additional questions or concerns   HISTORY OF PRESENTING ILLNESS:  Allison Sheppard 36 y.o. female is here because of IDA  This is a very pleasant 36 year old female patient with no significant past medical history with chief complaint of fatigue which has worsened over the past several months noted to have iron deficiency anemia hence referred to  hematology for further recommendations.  Her last labs prior to this were back in June which showed severe iron deficiency but mild anemia with a ferritin of 6. She tried oral iron supplementation for a month, did not have repeat labs however her fatigue did not improve hence discontinued it.  She has 2 young children 8 and 1, breast-fed the last child.  She has monthly menstrual cycles, most recent menstrual cycle was 10 days ago.  She denies any pregnancy but does not use any form of contraception. She denies any blood per rectum.  Rest of the pertinent 10 point ROS reviewed and negative  MEDICAL HISTORY:  Past Medical History:  Diagnosis Date   Abnormal Pap smear    Chlamydia    Elective abortion     SURGICAL HISTORY: Past Surgical History:  Procedure Laterality Date   DILATION AND CURETTAGE OF UTERUS     NO PAST SURGERIES      SOCIAL HISTORY: Social History   Socioeconomic History   Marital status: Single    Spouse name: Not on file   Number of children: Not on file   Years of education: Not on file   Highest education level: Not on file  Occupational History   Not on file  Tobacco Use   Smoking status: Every Day    Packs/day: 0.50    Types: Cigarettes    Last attempt to quit: 10/05/2011    Years since quitting: 10.0   Smokeless tobacco: Never  Vaping Use   Vaping Use: Never used  Substance and  Sexual Activity   Alcohol use: No   Drug use: No   Sexual activity: Not Currently    Birth control/protection: Pill  Other Topics Concern   Not on file  Social History Narrative   Not on file   Social Determinants of Health   Financial Resource Strain: Not on file  Food Insecurity: Not on file  Transportation Needs: Not on file  Physical Activity: Not on file  Stress: Not on file  Social Connections: Not on file  Intimate Partner Violence: Not on file    FAMILY HISTORY: Family History  Problem Relation Age of Onset   Other Neg Hx     ALLERGIES:  has No Known  Allergies.  MEDICATIONS:  Current Outpatient Medications  Medication Sig Dispense Refill   acetaminophen (TYLENOL) 500 MG tablet Take 500 mg by mouth every 6 (six) hours as needed for mild pain or headache.     ibuprofen (ADVIL) 600 MG tablet Take 1 tablet (600 mg total) by mouth every 6 (six) hours. 30 tablet 0   sertraline (ZOLOFT) 50 MG tablet Take 1 tablet (50 mg total) by mouth daily. 90 tablet 1   No current facility-administered medications for this visit.     PHYSICAL EXAMINATION: ECOG PERFORMANCE STATUS: 0 - Asymptomatic  Vitals:   10/06/21 1403  BP: (!) 143/81  Pulse: 80  Resp: 16  Temp: 98.4 F (36.9 C)  SpO2: 98%   Filed Weights   10/06/21 1403  Weight: 253 lb 14.4 oz (115.2 kg)    GENERAL:alert, no distress and comfortable, obese SKIN: skin color, texture, turgor are normal, no rashes or significant lesions EYES: normal, conjunctiva are pink and non-injected, sclera clear OROPHARYNX:no exudate, no erythema and lips, buccal mucosa, and tongue normal  NECK: supple, thyroid normal size, non-tender, without nodularity LYMPH:  no palpable lymphadenopathy in the cervical, axillary or inguinal LUNGS: clear to auscultation and percussion with normal breathing effort HEART: regular rate & rhythm and no murmurs and no lower extremity edema ABDOMEN:abdomen soft, non-tender and normal bowel sounds Musculoskeletal:no cyanosis of digits and no clubbing  PSYCH: alert & oriented x 3 with fluent speech NEURO: no focal motor/sensory deficits  LABORATORY DATA:  I have reviewed the data as listed Lab Results  Component Value Date   WBC 10.7 (H) 06/23/2021   HGB 12.8 06/23/2021   HCT 39.3 06/23/2021   MCV 84.9 06/23/2021   PLT 382.0 06/23/2021     Chemistry      Component Value Date/Time   NA 137 06/23/2021 1459   K 3.7 06/23/2021 1459   CL 102 06/23/2021 1459   CO2 28 06/23/2021 1459   BUN 9 06/23/2021 1459   CREATININE 0.66 06/23/2021 1459      Component Value  Date/Time   CALCIUM 9.3 06/23/2021 1459   ALKPHOS 77 06/23/2021 1459   AST 19 06/23/2021 1459   ALT 17 06/23/2021 1459   BILITOT 0.4 06/23/2021 1459       RADIOGRAPHIC STUDIES: I have personally reviewed the radiological images as listed and agreed with the findings in the report. No results found.  All questions were answered. The patient knows to call the clinic with any problems, questions or concerns. I spent 30 minutes in the care of this patient including H and P, review of records, counseling and coordination of care.     Rachel Moulds, MD 10/06/2021 2:31 PM

## 2021-10-29 ENCOUNTER — Encounter: Payer: Self-pay | Admitting: Nurse Practitioner

## 2021-10-29 ENCOUNTER — Telehealth (INDEPENDENT_AMBULATORY_CARE_PROVIDER_SITE_OTHER): Payer: 59 | Admitting: Nurse Practitioner

## 2021-10-29 VITALS — Ht 62.0 in

## 2021-10-29 DIAGNOSIS — K1370 Unspecified lesions of oral mucosa: Secondary | ICD-10-CM | POA: Diagnosis not present

## 2021-10-29 DIAGNOSIS — G43839 Menstrual migraine, intractable, without status migrainosus: Secondary | ICD-10-CM

## 2021-10-29 DIAGNOSIS — A6000 Herpesviral infection of urogenital system, unspecified: Secondary | ICD-10-CM

## 2021-10-29 DIAGNOSIS — E611 Iron deficiency: Secondary | ICD-10-CM

## 2021-10-29 DIAGNOSIS — F419 Anxiety disorder, unspecified: Secondary | ICD-10-CM

## 2021-10-29 MED ORDER — VALACYCLOVIR HCL 1 G PO TABS: 1000.0000 mg | ORAL_TABLET | Freq: Every day | ORAL | 2 refills | Status: AC

## 2021-10-29 NOTE — Assessment & Plan Note (Signed)
Patient will continue to follow-up with hematology.

## 2021-10-29 NOTE — Assessment & Plan Note (Signed)
Etiology unclear, referral to ENT made today.

## 2021-10-29 NOTE — Assessment & Plan Note (Signed)
Discussed episodic versus suppressive therapy options.  Patient would like to try episodic treatment.  Valacyclovir 1 g by mouth daily for 7 days sent to patient's pharmacy.

## 2021-10-29 NOTE — Assessment & Plan Note (Signed)
Continue sertraline 50 mg by mouth daily.

## 2021-10-29 NOTE — Assessment & Plan Note (Addendum)
Intermittent, recommend patient use Aleve if this is helping to abort headaches.  If this fails to help headaches she can try over-the-counter Excedrin.  If this continues to fail may consider trying triptan.  Patient reports understanding will trial Excedrin if needed and will let me know if they persist or worsen.

## 2021-10-29 NOTE — Progress Notes (Signed)
Established Patient Office Visit  An audio/visual tele-health visit was completed today for this patient. I connected with  Allison Sheppard on 10/29/21 utilizing audio/visual technology and verified that I am speaking with the correct person using two identifiers. The patient was located at their car, and I was located at the office of [place] at [facility] during the encounter. I discussed the limitations of evaluation and management by telemedicine. The patient expressed understanding and agreed to proceed.     Subjective   Patient ID: Allison Sheppard, female    DOB: 20-May-1985  Age: 36 y.o. MRN: [id number]  Chief Complaint  Patient presents with   Follow-up    Wants to talk about headache, always happens on her menstrual cycle taking alive which helps     Oral lesion/iron deficiency anemia: Being evaluated by hematology.  Currently on p.o. iron, plan is to follow back up with them in about 2 months and consider whether or not she should transition to IV iron or stay on oral iron.  Hematologist does not feel oral lesions are related to iron deficiency anemia and recommend referral to ENT.  Anxiety: Start sertraline 50 mg by mouth daily, reports mood is much improved with starting medication.  Denies suicidal ideation.  Headache: Has noticed menstrual headaches that occur during the week of her menstrual period.  Has occurred the last 3 menstrual periods. was taking Tylenol without improvement, switch to Aleve which helps headaches much more.  Denies having nausea, vomiting, or photophobia with headache.  Headache is located at the top of her head.  General herpes: Reports current outbreak and would like treatment.  Creatinine clearance approximate 145 based on last metabolic panel.    Review of Systems  Eyes:  Negative for blurred vision and photophobia.  Gastrointestinal:  Negative for nausea and vomiting.  Neurological:  Positive for headaches.   Psychiatric/Behavioral:  Negative for suicidal ideas. The patient does not have insomnia.       Objective:     Ht 5\' 2"  (1.575 m)   LMP 10/24/2021   BMI 46.44 kg/m    Physical Exam Comprehensive physical exam not completed today as office visit was conducted remotely.  Patient appears well over video.  Patient was alert and oriented, and appeared to have appropriate judgment.      10/29/2021    9:08 AM 09/17/2021    9:05 AM 07/23/2021    1:38 PM  PHQ9 SCORE ONLY  PHQ-9 Total Score 0 4 7     No results found for any visits on 10/29/21.    The ASCVD Risk score (Arnett DK, et al., 2019) failed to calculate for the following reasons:   The 2019 ASCVD risk score is only valid for ages 24 to 62    Assessment & Plan:   Problem List Items Addressed This Visit       Cardiovascular and Mediastinum   Intractable menstrual migraine without status migrainosus    Intermittent, recommend patient use Aleve if this is helping to abort headaches.  If this fails to help headaches she can try over-the-counter Excedrin.  If this continues to fail may consider trying triptan.  Patient reports understanding will trial Excedrin if needed and will let me know if they persist or worsen.        Digestive   Oral lesion - Primary    Etiology unclear, referral to ENT made today.      Relevant Orders   Ambulatory referral to  ENT     Genitourinary   Genital herpes simplex    Discussed episodic versus suppressive therapy options.  Patient would like to try episodic treatment.  Valacyclovir 1 g by mouth daily for 7 days sent to patient's pharmacy.      Relevant Medications   valACYclovir (VALTREX) 1000 MG tablet     Other   Iron deficiency    Patient will continue to follow-up with hematology.      Anxiety    Continue sertraline 50 mg by mouth daily.       Return in about 6 months (around 04/30/2022).    Elenore Paddy, NP

## 2021-11-25 ENCOUNTER — Ambulatory Visit: Payer: 59 | Admitting: Hematology and Oncology

## 2022-04-01 ENCOUNTER — Ambulatory Visit (INDEPENDENT_AMBULATORY_CARE_PROVIDER_SITE_OTHER): Payer: 59 | Admitting: Nurse Practitioner

## 2022-04-01 ENCOUNTER — Other Ambulatory Visit: Payer: Self-pay | Admitting: Nurse Practitioner

## 2022-04-01 VITALS — BP 144/90 | HR 75 | Temp 98.6°F | Ht 62.0 in | Wt 249.0 lb

## 2022-04-01 DIAGNOSIS — R519 Headache, unspecified: Secondary | ICD-10-CM | POA: Diagnosis not present

## 2022-04-01 DIAGNOSIS — I10 Essential (primary) hypertension: Secondary | ICD-10-CM

## 2022-04-01 DIAGNOSIS — F419 Anxiety disorder, unspecified: Secondary | ICD-10-CM

## 2022-04-01 DIAGNOSIS — R5383 Other fatigue: Secondary | ICD-10-CM

## 2022-04-01 LAB — CBC
HCT: 40.6 % (ref 36.0–46.0)
Hemoglobin: 13.5 g/dL (ref 12.0–15.0)
MCHC: 33.3 g/dL (ref 30.0–36.0)
MCV: 84.3 fl (ref 78.0–100.0)
Platelets: 388 10*3/uL (ref 150.0–400.0)
RBC: 4.81 Mil/uL (ref 3.87–5.11)
RDW: 15 % (ref 11.5–15.5)
WBC: 7.3 10*3/uL (ref 4.0–10.5)

## 2022-04-01 LAB — BASIC METABOLIC PANEL
BUN: 7 mg/dL (ref 6–23)
CO2: 27 mEq/L (ref 19–32)
Calcium: 9.4 mg/dL (ref 8.4–10.5)
Chloride: 104 mEq/L (ref 96–112)
Creatinine, Ser: 0.71 mg/dL (ref 0.40–1.20)
GFR: 111 mL/min (ref >60.00)
Glucose, Bld: 82 mg/dL (ref 70–99)
Potassium: 3.7 mEq/L (ref 3.5–5.1)
Sodium: 138 mEq/L (ref 135–145)

## 2022-04-01 LAB — FERRITIN: Ferritin: 6.2 ng/mL — ABNORMAL LOW (ref 10.0–291.0)

## 2022-04-01 LAB — IRON: Iron: 59 ug/dL (ref 42–145)

## 2022-04-01 MED ORDER — AMLODIPINE BESYLATE 5 MG PO TABS: 5.0000 mg | ORAL_TABLET | Freq: Every day | ORAL | 1 refills | Status: DC

## 2022-04-01 MED ORDER — SERTRALINE HCL 25 MG PO TABS: 25.0000 mg | ORAL_TABLET | Freq: Every day | ORAL | 0 refills | Status: DC

## 2022-04-01 NOTE — Assessment & Plan Note (Signed)
I am concerned she may be anemic again, check labs today.  Further recommendations may be made based upon these results.

## 2022-04-01 NOTE — Assessment & Plan Note (Signed)
Has been ongoing for about 4 months, getting progressively worse.  Will treat hypertension to see if this resolves headache but will also refer to neurology for assistance with evaluation.  Discussed head imaging today, but patient would prefer to hold off on this.

## 2022-04-01 NOTE — Assessment & Plan Note (Signed)
Chronic, shared decision making conversation completed today.  Will initiate patient on amlodipine 5 mg a mouth daily patient will follow-up in 2 to 4 weeks for blood pressure check.

## 2022-04-01 NOTE — Assessment & Plan Note (Signed)
Chronic, discussed different treatment options such as switching to a different SSRI, trying Wellbutrin, taking as needed BuSpar, while reducing dose of Zoloft 25 mg by mouth daily and to start taking it as prescribed on a daily basis.  Patient feels most comfortable trying lower dose of Zoloft.  Patient to start Zoloft 25 mg mouth daily, follow-up in 2 to 4 weeks or sooner as needed.

## 2022-04-01 NOTE — Progress Notes (Signed)
Established Patient Office Visit  Subjective   Patient ID: Allison Sheppard, female    DOB: [birth year]  Age: 37 y.o. MRN: [id number]  Chief Complaint  Patient presents with   Hypertension    BP reading high, bad headache ( have to take OTC to help reduce the pain) causing her to not be able to ben her head, cough.   Patient comes in today for the above.  Headache: Has noticed daily headache and this seems to be occurring more frequently than it was in the past.  Does respond to NSAIDs.  No seizure, no double vision.  Does have photophobia but no nausea or vomiting with it.  Has had elevated blood pressure when being seen by medical providers recently in the past. Fatigue: Does have a history of anemia not currently taking any iron supplementation.  Patient reports headaches been bothering her so much she has neglected other medical conditions including her anemia. Anxiety: Has been taking Zoloft only as needed, feels that it makes her very sedated.  Would like to discuss treatment options.     Review of Systems  HENT:  Negative for congestion.        (+) PND  Eyes:  Positive for blurred vision and photophobia. Negative for double vision.  Respiratory:  Positive for shortness of breath.   Cardiovascular:  Negative for chest pain.  Gastrointestinal:  Negative for nausea and vomiting.  Neurological:  Positive for headaches. Negative for seizures and loss of consciousness.      Objective:     BP (!) 144/90   Pulse 75   Temp 98.6 F (37 C) (Oral)   Ht '5\' 2"'$  (1.575 m)   Wt 249 lb (112.9 kg)   SpO2 99%   BMI 45.54 kg/m  BP Readings from Last 3 Encounters:  04/01/22 (!) 144/90  10/06/21 (!) 143/81  09/17/21 (!) 142/104   Wt Readings from Last 3 Encounters:  04/01/22 249 lb (112.9 kg)  10/06/21 253 lb 14.4 oz (115.2 kg)  09/17/21 256 lb 2 oz (116.2 kg)        04/01/2022   11:15 AM 10/29/2021    9:08 AM 09/17/2021    9:05 AM  PHQ9 SCORE ONLY  PHQ-9 Total Score 12  0 4     Physical Exam Vitals reviewed.  Constitutional:      General: She is not in acute distress.    Appearance: Normal appearance.  HENT:     Head: Normocephalic and atraumatic.  Neck:     Vascular: No carotid bruit.  Cardiovascular:     Rate and Rhythm: Normal rate and regular rhythm.     Pulses: Normal pulses.     Heart sounds: Normal heart sounds.  Pulmonary:     Effort: Pulmonary effort is normal.     Breath sounds: Normal breath sounds.  Skin:    General: Skin is warm and dry.  Neurological:     General: No focal deficit present.     Mental Status: She is alert and oriented to person, place, and time.  Psychiatric:        Mood and Affect: Mood normal.        Behavior: Behavior normal.        Judgment: Judgment normal.      No results found for any visits on 04/01/22.    The ASCVD Risk score (Arnett DK, et al., 2019) failed to calculate for the following reasons:   The 2019 ASCVD risk score is  only valid for ages 73 to 5    Assessment & Plan:   Problem List Items Addressed This Visit       Cardiovascular and Mediastinum   Hypertension - Primary    Chronic, shared decision making conversation completed today.  Will initiate patient on amlodipine 5 mg a mouth daily patient will follow-up in 2 to 4 weeks for blood pressure check.      Relevant Medications   amLODipine (NORVASC) 5 MG tablet     Other   Fatigue    I am concerned she may be anemic again, check labs today.  Further recommendations may be made based upon these results.      Relevant Orders   CBC   Basic metabolic panel   Ferritin   Iron   Anxiety    Chronic, discussed different treatment options such as switching to a different SSRI, trying Wellbutrin, taking as needed BuSpar, while reducing dose of Zoloft 25 mg by mouth daily and to start taking it as prescribed on a daily basis.  Patient feels most comfortable trying lower dose of Zoloft.  Patient to start Zoloft 25 mg mouth daily,  follow-up in 2 to 4 weeks or sooner as needed.      Relevant Medications   sertraline (ZOLOFT) 25 MG tablet   Headache    Has been ongoing for about 4 months, getting progressively worse.  Will treat hypertension to see if this resolves headache but will also refer to neurology for assistance with evaluation.  Discussed head imaging today, but patient would prefer to hold off on this.        Relevant Medications   amLODipine (NORVASC) 5 MG tablet   sertraline (ZOLOFT) 25 MG tablet   Other Relevant Orders   Ambulatory referral to Neurology    Return in about 2 weeks (around 04/15/2022) for F/U with Turki Tapanes.    Ailene Ards, NP

## 2022-04-02 ENCOUNTER — Encounter: Payer: Self-pay | Admitting: Neurology

## 2022-04-23 ENCOUNTER — Ambulatory Visit: Payer: 59 | Admitting: Nurse Practitioner

## 2022-04-29 ENCOUNTER — Ambulatory Visit (INDEPENDENT_AMBULATORY_CARE_PROVIDER_SITE_OTHER): Payer: 59 | Admitting: Nurse Practitioner

## 2022-04-29 VITALS — BP 122/84 | HR 85 | Temp 98.4°F | Ht 62.0 in | Wt 256.1 lb

## 2022-04-29 DIAGNOSIS — F419 Anxiety disorder, unspecified: Secondary | ICD-10-CM | POA: Diagnosis not present

## 2022-04-29 DIAGNOSIS — I10 Essential (primary) hypertension: Secondary | ICD-10-CM

## 2022-04-29 DIAGNOSIS — R519 Headache, unspecified: Secondary | ICD-10-CM | POA: Diagnosis not present

## 2022-04-29 MED ORDER — SERTRALINE HCL 50 MG PO TABS: 50.0000 mg | ORAL_TABLET | Freq: Every day | ORAL | 3 refills | Status: DC

## 2022-04-29 NOTE — Assessment & Plan Note (Signed)
Chronic, patient is concerned that sertraline may be contributing to weight gain and craving of sodas.  She reports that when she was on 50 mg/day she did not experiences cravings.  Per shared decision making will increase to 50 mg by mouth daily.  Discussed possibly changing to Wellbutrin, however patient is not on contraception currently and would like to stay off of it for now as she is not sexually active.  Thus we will switch back to sertraline 50 mg by mouth daily as this is a bit of a safer option in case a pregnancy were to occur.  Follow-up in 8 weeks, or sooner as needed.

## 2022-04-29 NOTE — Assessment & Plan Note (Signed)
Much improved with improvement in patient's blood pressure.  Patient would prefer to cancel upcoming neurology appointment which I am agreeable to if headaches have resolved.

## 2022-04-29 NOTE — Progress Notes (Signed)
Established Patient Office Visit  Subjective   Patient ID: Allison Sheppard, female    DOB: 1985/04/26  Age: 37 y.o. MRN: [id number]  Chief Complaint  Patient presents with   Medical Management of Chronic Issues    A follow up, no new concern     Anxiety; has started taking sertraline 25 mg/day over the last couple of weeks.  Tolerating medication well but has noticed cravings for sodas and subsequent weight gain. Hypertension/Headaches: Started amlodipine 5 mg daily over the last couple weeks.  Reports headaches are much improved and mostly have resolved.    ROS: see HPI    Objective:     BP 122/84   Pulse 85   Temp 98.4 F (36.9 C) (Oral)   Ht 5\' 2"  (1.575 m)   Wt 256 lb 2 oz (116.2 kg)   SpO2 97%   BMI 46.85 kg/m  BP Readings from Last 3 Encounters:  04/29/22 122/84  04/01/22 (!) 144/90  10/06/21 (!) 143/81   Wt Readings from Last 3 Encounters:  04/29/22 256 lb 2 oz (116.2 kg)  04/01/22 249 lb (112.9 kg)  10/06/21 253 lb 14.4 oz (115.2 kg)      Physical Exam Vitals reviewed.  Constitutional:      General: She is not in acute distress.    Appearance: Normal appearance.  HENT:     Head: Normocephalic and atraumatic.  Neck:     Vascular: No carotid bruit.  Cardiovascular:     Rate and Rhythm: Normal rate and regular rhythm.     Pulses: Normal pulses.     Heart sounds: Normal heart sounds.  Pulmonary:     Effort: Pulmonary effort is normal.     Breath sounds: Normal breath sounds.  Skin:    General: Skin is warm and dry.  Neurological:     General: No focal deficit present.     Mental Status: She is alert and oriented to person, place, and time.  Psychiatric:        Mood and Affect: Mood normal.        Behavior: Behavior normal.        Judgment: Judgment normal.      No results found for any visits on 04/29/22.    The ASCVD Risk score (Arnett DK, et al., 2019) failed to calculate for the following reasons:   The 2019 ASCVD risk  score is only valid for ages 40 to 29    Assessment & Plan:   Problem List Items Addressed This Visit       Cardiovascular and Mediastinum   Hypertension    Chronic, improved with initiation of amlodipine 5 mg daily.  Continue take medication as prescribed.  Patient not currently sexually active, but is not using anything for contraception currently.  Patient was counseled that if she were to become pregnant that she should notify me or OB/GYN at which point may consider changing to a different agent.  Patient reports understanding.        Other   Anxiety - Primary    Chronic, patient is concerned that sertraline may be contributing to weight gain and craving of sodas.  She reports that when she was on 50 mg/day she did not experiences cravings.  Per shared decision making will increase to 50 mg by mouth daily.  Discussed possibly changing to Wellbutrin, however patient is not on contraception currently and would like to stay off of it for now as she is not  sexually active.  Thus we will switch back to sertraline 50 mg by mouth daily as this is a bit of a safer option in case a pregnancy were to occur.  Follow-up in 8 weeks, or sooner as needed.      Relevant Medications   sertraline (ZOLOFT) 50 MG tablet   Headache    Much improved with improvement in patient's blood pressure.  Patient would prefer to cancel upcoming neurology appointment which I am agreeable to if headaches have resolved.      Relevant Medications   sertraline (ZOLOFT) 50 MG tablet    Return in about 8 weeks (around 06/24/2022) for F/U with Maralyn Sago.    Elenore Paddy, NP

## 2022-04-29 NOTE — Assessment & Plan Note (Signed)
Chronic, improved with initiation of amlodipine 5 mg daily.  Continue take medication as prescribed.  Patient not currently sexually active, but is not using anything for contraception currently.  Patient was counseled that if she were to become pregnant that she should notify me or OB/GYN at which point may consider changing to a different agent.  Patient reports understanding.

## 2022-04-30 ENCOUNTER — Ambulatory Visit: Payer: 59 | Admitting: Nurse Practitioner

## 2022-06-25 ENCOUNTER — Encounter: Payer: Self-pay | Admitting: Nurse Practitioner

## 2022-06-25 ENCOUNTER — Ambulatory Visit (INDEPENDENT_AMBULATORY_CARE_PROVIDER_SITE_OTHER): Payer: 59 | Admitting: Nurse Practitioner

## 2022-06-25 VITALS — BP 120/80 | HR 60 | Temp 98.3°F | Ht 62.0 in | Wt 252.0 lb

## 2022-06-25 DIAGNOSIS — F419 Anxiety disorder, unspecified: Secondary | ICD-10-CM

## 2022-06-25 DIAGNOSIS — R519 Headache, unspecified: Secondary | ICD-10-CM

## 2022-06-25 DIAGNOSIS — R200 Anesthesia of skin: Secondary | ICD-10-CM | POA: Diagnosis not present

## 2022-06-25 MED ORDER — SUMATRIPTAN SUCCINATE 50 MG PO TABS: 50.0000 mg | ORAL_TABLET | Freq: Every day | ORAL | 0 refills | Status: DC | PRN

## 2022-06-25 NOTE — Assessment & Plan Note (Signed)
Etiology unclear, may be consistent with cervical and lumbar radiculopathy.  However will refer to neurology for assistance with evaluation.

## 2022-06-25 NOTE — Progress Notes (Signed)
Established Patient Office Visit  Subjective   Patient ID: Allison Sheppard, female    DOB: 1985/03/04  Age: 37 y.o. MRN: [id number]  Chief Complaint  Patient presents with   Follow-up    Pt states that she is getting headaches patient states that she believes it may be migraines.Pt does take OTC medicine     Depression/Anxiety: Currently on Zoloft 50 mg daily, tolerating well.  Reports mood is stable denies any suicidal ideation today. Migraine: Has history of menstrual migraines, reports she has been experiencing them again still usually around her menstrual cycle.  She also notices double vision during the headache which resolves once headache resolves.  She takes over-the-counter generic Excedrin, which does help alleviate her symptoms.  However it takes 4 tablets at once to help her symptoms.  Per package label she is not to exceed 2 tablets in a 24-hour period.  For the last year and a half she has noticed with migraines the double vision is occurring.  She also has numbness in her right upper and lower extremity which has been present for at least 2 years.  She reports that initially occurred during her most recent pregnancy but never resolved after giving birth to her child who is now 57 years old.  She reports her entire hand will go numb at times to the point where she will drop items.  Has not been evaluated for this previously.    Review of Systems  Eyes:  Positive for double vision and photophobia. Negative for blurred vision.  Gastrointestinal:  Positive for nausea. Negative for vomiting.  Psychiatric/Behavioral:  Negative for depression and suicidal ideas. The patient is not nervous/anxious.       Objective:     BP 120/80 (BP Location: Left Arm, Patient Position: Sitting, Cuff Size: Normal)   Pulse 60   Temp 98.3 F (36.8 C) (Oral)   Ht 5\' 2"  (1.575 m)   Wt 252 lb (114.3 kg)   LMP 06/14/2022   SpO2 99%   BMI 46.09 kg/m       04/29/2022   11:27 AM 04/01/2022    11:15 AM 10/29/2021    9:08 AM  PHQ9 SCORE ONLY  PHQ-9 Total Score 0 12 0     Physical Exam Vitals reviewed.  Constitutional:      General: She is not in acute distress.    Appearance: Normal appearance.  HENT:     Head: Normocephalic and atraumatic.  Eyes:     Extraocular Movements: Extraocular movements intact.     Pupils: Pupils are equal, round, and reactive to light. Pupils are equal.  Neck:     Vascular: No carotid bruit.  Cardiovascular:     Rate and Rhythm: Normal rate and regular rhythm.     Pulses: Normal pulses.     Heart sounds: Normal heart sounds.  Pulmonary:     Effort: Pulmonary effort is normal.     Breath sounds: Normal breath sounds.  Skin:    General: Skin is warm and dry.  Neurological:     General: No focal deficit present.     Mental Status: She is alert and oriented to person, place, and time.     Cranial Nerves: Cranial nerves 2-12 are intact.     Sensory: Sensory deficit (rue and rle decreased sensation) present.     Motor: Motor function is intact.     Gait: Gait is intact.  Psychiatric:        Mood and Affect:  Mood normal.        Behavior: Behavior normal.        Judgment: Judgment normal.      No results found for any visits on 06/25/22.    The ASCVD Risk score (Arnett DK, et al., 2019) failed to calculate for the following reasons:   The 2019 ASCVD risk score is only valid for ages 15 to 69    Assessment & Plan:   Problem List Items Addressed This Visit       Other   Anxiety    Chronic, stable Continue sertraline 50 mg daily      Headache - Primary    Chronic, intermittent Sounds consistent with migraine headache.  Continue to treat with Excedrin 1 to 2 tablets by mouth as needed daily for headache.  If symptoms persist can try sumatriptan hand 50 mg by mouth once a day as needed. Will refer again to neurology for assistance with evaluation and management due to the somewhat new onset of double vision over the last year  with her migraines.      Relevant Medications   SUMAtriptan (IMITREX) 50 MG tablet   Numbness    Etiology unclear, may be consistent with cervical and lumbar radiculopathy.  However will refer to neurology for assistance with evaluation.      Relevant Orders   Ambulatory referral to Neurology    Return in about 6 months (around 12/25/2022) for CPE with Nial Hawe.    Elenore Paddy, NP

## 2022-06-25 NOTE — Assessment & Plan Note (Signed)
Chronic, stable. Continue sertraline 50mg daily. 

## 2022-06-25 NOTE — Assessment & Plan Note (Signed)
Chronic, intermittent Sounds consistent with migraine headache.  Continue to treat with Excedrin 1 to 2 tablets by mouth as needed daily for headache.  If symptoms persist can try sumatriptan hand 50 mg by mouth once a day as needed. Will refer again to neurology for assistance with evaluation and management due to the somewhat new onset of double vision over the last year with her migraines.

## 2022-06-28 DIAGNOSIS — L819 Disorder of pigmentation, unspecified: Secondary | ICD-10-CM | POA: Insufficient documentation

## 2022-07-01 ENCOUNTER — Encounter: Payer: Self-pay | Admitting: Neurology

## 2022-07-13 ENCOUNTER — Telehealth: Payer: Self-pay | Admitting: Nurse Practitioner

## 2022-07-13 DIAGNOSIS — I10 Essential (primary) hypertension: Secondary | ICD-10-CM

## 2022-07-13 DIAGNOSIS — R519 Headache, unspecified: Secondary | ICD-10-CM

## 2022-07-13 NOTE — Telephone Encounter (Signed)
Patient states that she did not receive her sumatriptin that was sent in on the 06/25/2022 - Patient also needs her amlodopine sent to [facility].

## 2022-07-15 MED ORDER — SUMATRIPTAN SUCCINATE 50 MG PO TABS: 50.0000 mg | ORAL_TABLET | Freq: Every day | ORAL | 0 refills | Status: AC | PRN

## 2022-07-15 MED ORDER — AMLODIPINE BESYLATE 5 MG PO TABS: ORAL_TABLET | ORAL | 0 refills | Status: DC

## 2022-07-15 NOTE — Telephone Encounter (Signed)
Called pt back inform MD sent to [facility] [address].Pt states she use [facility]. Inform will update pharmacy and send both script to [facility] [address].Marland KitchenRaechel Chute

## 2022-07-27 NOTE — Progress Notes (Unsigned)
NEUROLOGY CONSULTATION NOTE  Tiandra Swiss MRN: [id number] DOB: 04-01-1985  Referring provider: Jiles Prows, NP Primary care provider: Jiles Prows, NP  Reason for consult:  headaches and numbness  Assessment/Plan:   Suspect right carpal tunnel syndrome Bilateral feet paresthesias may be related to poor circulation in the legs when sitting. Migraine without aura, without status migrainosus, not intractable Subjective right sided facial numbness - unclear etiology.  May be related to her migraines (as they are right-sided).  She declines MRI of brain at this time, which is reasonable as symptoms are vague and otherwise neurologic exam unremarkable   Check NCV-EMG of right upper extremity.  In meantime, advised to wear wrist splint Check vitamin B12 level Advised to take sumatriptan at earliest onset of headache. Declines starting a preventative medication Further recommendations pending results.   Subjective:  KARONDA OREAR is a 37 year old female with HTN, depression and anxiety who presents for headaches and numbness.  History supplemented by referring provider's note.  In 2015, during her first pregnancy, she developed numbness in her right arm and hand.  After she gave birth, it would occur every now and then.  It got worse during her second pregnancy in 2022.  Now she has a constant pins and needles sensation with numbness in the entire right hand.  It radiates up her arm when she is laying in bed at night.  She had discomfort when she holds objects such as her phone.  The only time she has relief is when her arm is hanging straight by her side.  She has history of arthritis of the right acromioclavicular joint.    Also, since her second pregnancy, she notices that her feet feel like they fall asleep when she sits down.  It resolves quickly after she stands up.    She has history of menstrual migraines.  They started becoming more frequent in late 2022-early 2023.  She  describes a right sided pressure pain with blurred vision, photophobia and phonophobia but no nausea, vomiting, or associated numbness or weakness.  They occur all day but that is because she only takes the sumatriptan at night before bed.  They occur about twice a week.     PAST MEDICAL HISTORY: Past Medical History:  Diagnosis Date   Abnormal Pap smear    Chlamydia    Elective abortion     PAST SURGICAL HISTORY: Past Surgical History:  Procedure Laterality Date   DILATION AND CURETTAGE OF UTERUS     NO PAST SURGERIES      MEDICATIONS: Current Outpatient Medications on File Prior to Visit  Medication Sig Dispense Refill   acetaminophen (TYLENOL) 500 MG tablet Take 500 mg by mouth every 6 (six) hours as needed for mild pain or headache.     amLODipine (NORVASC) 5 MG tablet TAKE 1 TABLET(5 MG) BY MOUTH DAILY 90 tablet 0   ibuprofen (ADVIL) 600 MG tablet Take 1 tablet (600 mg total) by mouth every 6 (six) hours. 30 tablet 0   sertraline (ZOLOFT) 50 MG tablet Take 1 tablet (50 mg total) by mouth daily. 30 tablet 3   SUMAtriptan (IMITREX) 50 MG tablet Take 1 tablet (50 mg total) by mouth daily as needed for up to 1 dose for migraine. 5 tablet 0   No current facility-administered medications on file prior to visit.    ALLERGIES: No Known Allergies  FAMILY HISTORY: Family History  Problem Relation Age of Onset   Other Neg Hx  Objective:  Blood pressure 131/84, pulse 94, resp. rate 20, height 5\' 2"  (1.575 m), weight 260 lb (117.9 kg), SpO2 99 %, unknown if currently breastfeeding. General: No acute distress.  Patient appears well-groomed.   Head:  Normocephalic/atraumatic Eyes:  fundi examined but not visualized Neck: supple, no paraspinal tenderness, full range of motion Back: No paraspinal tenderness Heart: regular rate and rhythm Lungs: Clear to auscultation bilaterally. Vascular: No carotid bruits. Neurological Exam: Mental status: alert and oriented to person,  place, and time, speech fluent and not dysarthric, language intact. Cranial nerves: CN I: not tested CN II: pupils equal, round and reactive to light, visual fields intact CN III, IV, VI:  full range of motion, no nystagmus, no ptosis CN V: endorses reduced sensation to light touch over right V1-V3 CN VII: upper and lower face symmetric CN VIII: hearing intact CN IX, X: gag intact, uvula midline CN XI: sternocleidomastoid and trapezius muscles intact CN XII: tongue midline Bulk & Tone: normal, no fasciculations. Motor:  muscle strength 5/5 throughout Sensation:  Pinprick, temperature and vibratory sensation intact. Deep Tendon Reflexes:  2+ throughout,  toes downgoing.   Finger to nose testing:  Without dysmetria.   Heel to shin:  Without dysmetria.   Gait:  Normal station and stride.  Romberg negative. Tinel's and Phalen's negative    Thank you for allowing me to take part in the care of this patient.  Shon Millet, DO  CC: Jiles Prows, NP

## 2022-07-28 ENCOUNTER — Encounter: Payer: Self-pay | Admitting: Neurology

## 2022-07-28 ENCOUNTER — Ambulatory Visit (INDEPENDENT_AMBULATORY_CARE_PROVIDER_SITE_OTHER): Payer: 59 | Admitting: Neurology

## 2022-07-28 ENCOUNTER — Other Ambulatory Visit (INDEPENDENT_AMBULATORY_CARE_PROVIDER_SITE_OTHER): Payer: 59

## 2022-07-28 VITALS — BP 131/84 | HR 94 | Resp 20 | Ht 62.0 in | Wt 260.0 lb

## 2022-07-28 DIAGNOSIS — G43009 Migraine without aura, not intractable, without status migrainosus: Secondary | ICD-10-CM | POA: Diagnosis not present

## 2022-07-28 DIAGNOSIS — R200 Anesthesia of skin: Secondary | ICD-10-CM

## 2022-07-28 DIAGNOSIS — R202 Paresthesia of skin: Secondary | ICD-10-CM | POA: Diagnosis not present

## 2022-07-28 LAB — VITAMIN B12: Vitamin B-12: 459 pg/mL (ref 211–911)

## 2022-07-28 NOTE — Patient Instructions (Signed)
The right hand numbness may be carpal tunnel.  We will order a nerve conduction study.  Come back here to have it done.  In meantime, go to the pharmacy to buy a wrist splint to wear at bedtime (and as much during the day as possible) Take sumatriptan at earliest onset of headache.  May repeat after 2 hours if needed.  Maximum 2 tablets in 24 hours. Check B12 level Further recommendations pending results.

## 2022-07-28 NOTE — Addendum Note (Signed)
Addended by: Leida Lauth on: 07/28/2022 01:58 PM   Modules accepted: Orders

## 2022-07-29 ENCOUNTER — Telehealth: Payer: Self-pay | Admitting: Neurology

## 2022-07-29 NOTE — Telephone Encounter (Signed)
Patient called wanting the results from her lab test/KB

## 2022-07-29 NOTE — Progress Notes (Signed)
LMOVM for patient to call the office back.

## 2022-08-04 ENCOUNTER — Ambulatory Visit: Payer: 59 | Admitting: Neurology

## 2022-08-12 ENCOUNTER — Encounter: Payer: Self-pay | Admitting: Neurology

## 2022-08-12 ENCOUNTER — Encounter: Payer: 59 | Admitting: Neurology

## 2022-08-12 DIAGNOSIS — Z029 Encounter for administrative examinations, unspecified: Secondary | ICD-10-CM

## 2022-10-26 ENCOUNTER — Ambulatory Visit: Payer: 59 | Admitting: Neurology

## 2022-11-04 ENCOUNTER — Ambulatory Visit: Payer: 59 | Admitting: Family Medicine

## 2022-11-04 ENCOUNTER — Encounter: Payer: Self-pay | Admitting: Family Medicine

## 2022-11-04 VITALS — BP 126/82 | HR 86 | Temp 97.6°F | Ht 62.0 in | Wt 248.0 lb

## 2022-11-04 DIAGNOSIS — F419 Anxiety disorder, unspecified: Secondary | ICD-10-CM

## 2022-11-04 DIAGNOSIS — F32A Depression, unspecified: Secondary | ICD-10-CM | POA: Diagnosis not present

## 2022-11-04 MED ORDER — HYDROXYZINE HCL 10 MG PO TABS
10.0000 mg | ORAL_TABLET | Freq: Three times a day (TID) | ORAL | 0 refills | Status: AC | PRN
Start: 2022-11-04 — End: ?

## 2022-11-04 NOTE — Patient Instructions (Addendum)
Try hydroxyzine as needed.   Use distraction techniques such as taking deep breaths. Breathe in for 4 seconds, hold for 3 seconds and exhale for 5 seconds.   Count backwards from 10.   Take walks outdoors.   Continue counseling.   Follow up with Jiles Prows, NP in 4 weeks.    If you have any worsening concerns or thoughts of harming yourself, please go to  [facility] open 24/7 [address] [place], [place]

## 2022-11-04 NOTE — Progress Notes (Signed)
Subjective:     Patient ID: Allison Sheppard, female    DOB: 1985-03-27, 37 y.o.   MRN: [id number]  Chief Complaint  Patient presents with   Anxiety    Would like to discuss taking another medication to help w anxiety     Anxiety Symptoms include nervous/anxious behavior. Patient reports no chest pain, dizziness, nausea, palpitations, shortness of breath or suicidal ideas.       History of Present Illness         She is here for worsening anxiety and depression. Requesting prn medication to help with anxiety.   Started sertraline and felt sleepy and tired. States it helped her anxiety. She stopped the medication and then had side effects such as worsening depression and anxiety.   States she had thoughts of hurting herself and saw a Veterinary surgeon through her employer.  Denies SI or HI.   She is still seeing a Veterinary surgeon.   States she changed her job and is in a less stressful position.   LMP:  last month  Is not currently sexually active.      11/04/2022    1:06 PM 04/29/2022   11:27 AM 04/01/2022   11:15 AM 10/29/2021    9:08 AM 09/17/2021    9:05 AM  Depression screen PHQ 2/9  Decreased Interest 2 0 2 0 0  Down, Depressed, Hopeless 2 0 1 0 0  PHQ - 2 Score 4 0 3 0 0  Altered sleeping 0 0 3 0 2  Tired, decreased energy 2 0 3 0 2  Change in appetite 2 0 0 0 0  Feeling bad or failure about yourself  3 0 1 0 0  Trouble concentrating 2 0 2 0 0  Moving slowly or fidgety/restless 0 0 0 0 0  Suicidal thoughts 1 0 0 0 0  PHQ-9 Score 14 0 12 0 4  Difficult doing work/chores  Not difficult at all Somewhat difficult Not difficult at all Somewhat difficult      04/01/2022   11:14 AM  GAD 7 : Generalized Anxiety Score  Nervous, Anxious, on Edge 3  Control/stop worrying 3  Worry too much - different things 3  Trouble relaxing 3  Restless 2  Easily annoyed or irritable 1  Afraid - awful might happen 0  Total GAD 7 Score 15  Anxiety Difficulty Very difficult          Health Maintenance Due  Topic Date Due   Cervical Cancer Screening (HPV/Pap Cotest)  05/04/2020    Past Medical History:  Diagnosis Date   Abnormal Pap smear    Chlamydia    Elective abortion     Past Surgical History:  Procedure Laterality Date   DILATION AND CURETTAGE OF UTERUS     NO PAST SURGERIES      Family History  Problem Relation Age of Onset   Other Neg Hx     Social History   Socioeconomic History   Marital status: Single    Spouse name: Not on file   Number of children: Not on file   Years of education: Not on file   Highest education level: Some college, no degree  Occupational History   Not on file  Tobacco Use   Smoking status: Every Day    Current packs/day: 0.00    Types: Cigarettes    Last attempt to quit: 10/05/2011    Years since quitting: 11.0   Smokeless tobacco: Never  Vaping Use  Vaping status: Never Used  Substance and Sexual Activity   Alcohol use: No   Drug use: No   Sexual activity: Not Currently    Birth control/protection: Pill  Other Topics Concern   Not on file  Social History Narrative   Right handed   Drinks caffeine   One story home   Lives with two children   employed   Social Determinants of Health   Financial Resource Strain: Low Risk  (04/29/2022)   Overall Financial Resource Strain (CARDIA)    Difficulty of Paying Living Expenses: Not hard at all  Food Insecurity: Low Risk  (06/28/2022)   [facility]   Hunger Vital Sign    Worried About Running Out of Food in the Last Year: Never true    Ran Out of Food in the Last Year: Never true  Transportation Needs: No Transportation Needs (06/28/2022)   [facility]   Transportation    In the past 12 months, has lack of reliable transportation kept you from medical appointments, meetings, work or from getting things needed for daily living? : No  Physical Activity: Insufficiently Active  (04/29/2022)   Exercise Vital Sign    Days of Exercise per Week: 1 day    Minutes of Exercise per Session: 10 min  Stress: No Stress Concern Present (04/29/2022)   [employer] of Occupational Health - Occupational Stress Questionnaire    Feeling of Stress : Only a little  Social Connections: Moderately Integrated (04/29/2022)   Social Connection and Isolation Panel [NHANES]    Frequency of Communication with Friends and Family: More than three times a week    Frequency of Social Gatherings with Friends and Family: Three times a week    Attends Religious Services: More than 4 times per year    Active Member of Clubs or Organizations: Yes    Attends Banker Meetings: 1 to 4 times per year    Marital Status: Never married  Intimate Partner Violence: Not on file    Outpatient Medications Prior to Visit  Medication Sig Dispense Refill   acetaminophen (TYLENOL) 500 MG tablet Take 500 mg by mouth every 6 (six) hours as needed for mild pain or headache.     ibuprofen (ADVIL) 600 MG tablet Take 1 tablet (600 mg total) by mouth every 6 (six) hours. 30 tablet 0   SUMAtriptan (IMITREX) 50 MG tablet Take 1 tablet (50 mg total) by mouth daily as needed for up to 1 dose for migraine. 5 tablet 0   amLODipine (NORVASC) 5 MG tablet TAKE 1 TABLET(5 MG) BY MOUTH DAILY (Patient not taking: Reported on 11/04/2022) 90 tablet 0   sertraline (ZOLOFT) 50 MG tablet Take 1 tablet (50 mg total) by mouth daily. (Patient not taking: Reported on 11/04/2022) 30 tablet 3   No facility-administered medications prior to visit.    No Known Allergies  Review of Systems  Constitutional:  Negative for chills, fever and malaise/fatigue.  Respiratory:  Negative for shortness of breath.   Cardiovascular:  Negative for chest pain, palpitations and leg swelling.  Gastrointestinal:  Negative for abdominal pain, constipation, diarrhea, nausea and vomiting.  Genitourinary:  Negative for dysuria, frequency and  urgency.  Neurological:  Negative for dizziness and focal weakness.  Psychiatric/Behavioral:  Positive for depression. Negative for substance abuse and suicidal ideas. The patient is nervous/anxious.        Objective:    Physical Exam Constitutional:  General: She is not in acute distress.    Appearance: She is not ill-appearing.  Eyes:     Extraocular Movements: Extraocular movements intact.     Conjunctiva/sclera: Conjunctivae normal.  Cardiovascular:     Rate and Rhythm: Normal rate.  Pulmonary:     Effort: Pulmonary effort is normal.  Musculoskeletal:     Cervical back: Normal range of motion and neck supple.  Skin:    General: Skin is warm and dry.  Neurological:     General: No focal deficit present.     Mental Status: She is alert and oriented to person, place, and time.  Psychiatric:        Attention and Perception: Attention normal.        Mood and Affect: Mood and affect normal.        Speech: Speech normal.        Behavior: Behavior normal.        Thought Content: Thought content normal. Thought content does not include homicidal or suicidal ideation. Thought content does not include homicidal or suicidal plan.      BP 126/82 (BP Location: Left Arm, Patient Position: Sitting, Cuff Size: Large)   Pulse 86   Temp 97.6 F (36.4 C) (Temporal)   Ht 5\' 2"  (1.575 m)   Wt 248 lb (112.5 kg)   SpO2 100%   Breastfeeding No   BMI 45.36 kg/m  Wt Readings from Last 3 Encounters:  11/04/22 248 lb (112.5 kg)  07/28/22 260 lb (117.9 kg)  06/25/22 252 lb (114.3 kg)       Assessment & Plan:   Problem List Items Addressed This Visit   None Visit Diagnoses     Anxiety and depression    -  Primary   Relevant Medications   hydrOXYzine (ATARAX) 10 MG tablet      She is a pleasant 37 year old female patient of Jiles Prows, NP, who is here today for worsening anxiety and depression.  She stopped sertraline, did not want to take a daily medication.  Reports having  side effects such as sleepiness and fatigue while taking the medication.  Requests as needed medication.   I will prescribe low-dose hydroxyzine prn. She will continue seeing her counselor.  She is not having thoughts of self-harm.  Denies history of addiction or abuse.  We discussed distraction techniques and coping strategies for anxiety.  She will follow-up here in 4 weeks with Maralyn Sago or sooner if needed.  Visit time 24 minutes in face to face communication with patient and coordination of care, additional 8 minutes spent in record review, coordination or care, ordering tests, communicating/referring to other healthcare professionals, documenting in medical records all on the same day of the visit for total time 32 minutes spent on the visit.    I am having Tyeisha L. Grillot start on hydrOXYzine. I am also having her maintain her acetaminophen, ibuprofen, sertraline, SUMAtriptan, and amLODipine.  Meds ordered this encounter  Medications   hydrOXYzine (ATARAX) 10 MG tablet    Sig: Take 1 tablet (10 mg total) by mouth 3 (three) times daily as needed.    Dispense:  30 tablet    Refill:  0    Order Specific Question:   Supervising Provider    Answer:   Hillard Danker A [[id number]]

## 2022-12-23 ENCOUNTER — Encounter: Payer: 59 | Admitting: Nurse Practitioner

## 2023-01-05 ENCOUNTER — Other Ambulatory Visit: Payer: Self-pay | Admitting: Nurse Practitioner

## 2023-01-05 DIAGNOSIS — I10 Essential (primary) hypertension: Secondary | ICD-10-CM

## 2023-02-10 ENCOUNTER — Other Ambulatory Visit: Payer: Self-pay | Admitting: Nurse Practitioner

## 2023-02-10 DIAGNOSIS — F419 Anxiety disorder, unspecified: Secondary | ICD-10-CM

## 2023-09-13 ENCOUNTER — Other Ambulatory Visit: Payer: Self-pay | Admitting: Nurse Practitioner

## 2023-09-13 DIAGNOSIS — F419 Anxiety disorder, unspecified: Secondary | ICD-10-CM

## 2023-12-09 ENCOUNTER — Other Ambulatory Visit: Payer: Self-pay | Admitting: Nurse Practitioner

## 2023-12-09 DIAGNOSIS — I10 Essential (primary) hypertension: Secondary | ICD-10-CM

## 2024-01-09 ENCOUNTER — Other Ambulatory Visit: Payer: Self-pay

## 2024-01-09 ENCOUNTER — Encounter: Payer: Self-pay | Admitting: Nurse Practitioner

## 2024-01-09 DIAGNOSIS — I10 Essential (primary) hypertension: Secondary | ICD-10-CM

## 2024-01-09 MED ORDER — AMLODIPINE BESYLATE 5 MG PO TABS: ORAL_TABLET | ORAL | 2 refills | Status: AC

## 2024-01-09 NOTE — Telephone Encounter (Signed)
 Patient appointment for today 12/22 @ 220 rescheduled due to provider being sick.  Patient upset as she has been out of her BP medication since November and her BP, when checked by her aunt was high. She was unable to provide me with an acutal number but was very anxious about it and the need to go to the emergency room.   Patient was to rescheduled with Lauraine, as she only feels comfortable talking with Lauraine as she has finally found someone she trust. Patient stated she is very anxious and worried it will keep getting worse. Patient due for physical and next physical scheduled for 2/19 @3pm . Patient aware that 30 supply of Amlodipine  5mg  with one refills has been sent to pharmacy, and to call if has questions in the mean time.   No additional questions at this time.

## 2024-03-08 ENCOUNTER — Encounter: Admitting: Nurse Practitioner
